# Patient Record
Sex: Male | Born: 1965 | Race: White | Hispanic: No | Marital: Single | Smoking: Never smoker
Health system: Southern US, Community
[De-identification: ages and names within clinical notes are randomized; demographics above are authoritative.]

## PROBLEM LIST (undated history)

## (undated) DIAGNOSIS — Z21 Asymptomatic human immunodeficiency virus [HIV] infection status: Secondary | ICD-10-CM

## (undated) DIAGNOSIS — B20 Human immunodeficiency virus [HIV] disease: Secondary | ICD-10-CM

---

## 2004-01-11 ENCOUNTER — Encounter: Admission: RE | Admit: 2004-01-11 | Discharge: 2004-01-11 | Payer: Self-pay | Admitting: Infectious Diseases

## 2004-01-15 ENCOUNTER — Encounter: Admission: RE | Admit: 2004-01-15 | Discharge: 2004-01-15 | Payer: Self-pay | Admitting: Infectious Diseases

## 2004-01-15 ENCOUNTER — Encounter (INDEPENDENT_AMBULATORY_CARE_PROVIDER_SITE_OTHER): Payer: Self-pay | Admitting: *Deleted

## 2004-01-15 LAB — CONVERTED CEMR LAB: CD4 T Cell Abs: 490

## 2004-02-06 ENCOUNTER — Encounter: Admission: RE | Admit: 2004-02-06 | Discharge: 2004-02-06 | Payer: Self-pay | Admitting: Internal Medicine

## 2004-05-07 ENCOUNTER — Encounter: Admission: RE | Admit: 2004-05-07 | Discharge: 2004-05-07 | Payer: Self-pay | Admitting: Internal Medicine

## 2004-08-20 ENCOUNTER — Ambulatory Visit (HOSPITAL_COMMUNITY): Admission: RE | Admit: 2004-08-20 | Discharge: 2004-08-20 | Payer: Self-pay | Admitting: Internal Medicine

## 2004-08-20 ENCOUNTER — Encounter: Admission: RE | Admit: 2004-08-20 | Discharge: 2004-08-20 | Payer: Self-pay | Admitting: Internal Medicine

## 2005-02-14 ENCOUNTER — Encounter (INDEPENDENT_AMBULATORY_CARE_PROVIDER_SITE_OTHER): Payer: Self-pay | Admitting: *Deleted

## 2005-02-14 ENCOUNTER — Ambulatory Visit (HOSPITAL_COMMUNITY): Admission: RE | Admit: 2005-02-14 | Discharge: 2005-02-14 | Payer: Self-pay | Admitting: Internal Medicine

## 2005-02-14 ENCOUNTER — Ambulatory Visit: Payer: Self-pay | Admitting: Internal Medicine

## 2005-02-14 LAB — CONVERTED CEMR LAB: HIV 1 RNA Quant: 188000 copies/mL

## 2005-03-06 ENCOUNTER — Ambulatory Visit: Payer: Self-pay | Admitting: Internal Medicine

## 2006-05-14 ENCOUNTER — Ambulatory Visit: Payer: Self-pay | Admitting: Internal Medicine

## 2006-05-14 ENCOUNTER — Encounter (INDEPENDENT_AMBULATORY_CARE_PROVIDER_SITE_OTHER): Payer: Self-pay | Admitting: *Deleted

## 2006-05-14 ENCOUNTER — Encounter: Admission: RE | Admit: 2006-05-14 | Discharge: 2006-05-14 | Payer: Self-pay | Admitting: Internal Medicine

## 2006-05-28 ENCOUNTER — Encounter (INDEPENDENT_AMBULATORY_CARE_PROVIDER_SITE_OTHER): Payer: Self-pay | Admitting: *Deleted

## 2006-05-28 ENCOUNTER — Ambulatory Visit: Payer: Self-pay | Admitting: Internal Medicine

## 2006-05-28 ENCOUNTER — Encounter: Admission: RE | Admit: 2006-05-28 | Discharge: 2006-05-28 | Payer: Self-pay | Admitting: Internal Medicine

## 2006-07-07 ENCOUNTER — Encounter: Admission: RE | Admit: 2006-07-07 | Discharge: 2006-07-07 | Payer: Self-pay | Admitting: Internal Medicine

## 2006-07-07 ENCOUNTER — Encounter (INDEPENDENT_AMBULATORY_CARE_PROVIDER_SITE_OTHER): Payer: Self-pay | Admitting: *Deleted

## 2006-07-07 ENCOUNTER — Ambulatory Visit: Payer: Self-pay | Admitting: Internal Medicine

## 2006-07-07 LAB — CONVERTED CEMR LAB
CD4 Count: 190 microliters
HIV 1 RNA Quant: 2330 copies/mL

## 2006-08-03 ENCOUNTER — Ambulatory Visit: Payer: Self-pay | Admitting: Internal Medicine

## 2007-02-15 ENCOUNTER — Encounter (INDEPENDENT_AMBULATORY_CARE_PROVIDER_SITE_OTHER): Payer: Self-pay | Admitting: *Deleted

## 2007-02-15 LAB — CONVERTED CEMR LAB

## 2007-02-28 ENCOUNTER — Encounter (INDEPENDENT_AMBULATORY_CARE_PROVIDER_SITE_OTHER): Payer: Self-pay | Admitting: *Deleted

## 2007-03-16 ENCOUNTER — Ambulatory Visit: Payer: Self-pay | Admitting: Internal Medicine

## 2007-03-16 ENCOUNTER — Encounter: Admission: RE | Admit: 2007-03-16 | Discharge: 2007-03-16 | Payer: Self-pay | Admitting: Internal Medicine

## 2007-03-16 DIAGNOSIS — B20 Human immunodeficiency virus [HIV] disease: Secondary | ICD-10-CM

## 2007-03-29 DIAGNOSIS — B1910 Unspecified viral hepatitis B without hepatic coma: Secondary | ICD-10-CM | POA: Insufficient documentation

## 2007-03-29 DIAGNOSIS — R209 Unspecified disturbances of skin sensation: Secondary | ICD-10-CM

## 2007-03-29 DIAGNOSIS — G510 Bell's palsy: Secondary | ICD-10-CM

## 2007-03-30 ENCOUNTER — Ambulatory Visit: Payer: Self-pay | Admitting: Internal Medicine

## 2007-09-07 ENCOUNTER — Ambulatory Visit: Payer: Self-pay | Admitting: Internal Medicine

## 2007-09-07 ENCOUNTER — Encounter: Admission: RE | Admit: 2007-09-07 | Discharge: 2007-09-07 | Payer: Self-pay | Admitting: Internal Medicine

## 2007-09-07 LAB — CONVERTED CEMR LAB: HIV 1 RNA Quant: 251 copies/mL — ABNORMAL HIGH (ref <50)

## 2007-09-21 ENCOUNTER — Ambulatory Visit: Payer: Self-pay | Admitting: Internal Medicine

## 2007-11-11 ENCOUNTER — Telehealth: Payer: Self-pay | Admitting: Internal Medicine

## 2008-01-19 ENCOUNTER — Telehealth: Payer: Self-pay | Admitting: Internal Medicine

## 2008-03-23 ENCOUNTER — Telehealth (INDEPENDENT_AMBULATORY_CARE_PROVIDER_SITE_OTHER): Payer: Self-pay | Admitting: *Deleted

## 2008-06-13 ENCOUNTER — Ambulatory Visit: Payer: Self-pay | Admitting: Internal Medicine

## 2008-06-13 ENCOUNTER — Encounter: Admission: RE | Admit: 2008-06-13 | Discharge: 2008-06-13 | Payer: Self-pay | Admitting: Internal Medicine

## 2008-06-13 LAB — CONVERTED CEMR LAB
AST: 19 units/L (ref 0–37)
Albumin: 4.6 g/dL (ref 3.5–5.2)
Alkaline Phosphatase: 87 units/L (ref 39–117)
BUN: 14 mg/dL (ref 6–23)
Creatinine, Ser: 0.98 mg/dL (ref 0.40–1.50)
Glucose, Bld: 90 mg/dL (ref 70–99)
HCT: 42.7 % (ref 39.0–52.0)
HDL: 58 mg/dL (ref >39)
Hemoglobin: 15.1 g/dL (ref 13.0–17.0)
LDL Cholesterol: 125 mg/dL — ABNORMAL HIGH (ref 0–99)
MCHC: 35.4 g/dL (ref 30.0–36.0)
MCV: 89 fL (ref 78.0–100.0)
Potassium: 4.7 meq/L (ref 3.5–5.3)
RBC: 4.8 M/uL (ref 4.22–5.81)
RDW: 12.9 % (ref 11.5–15.5)
Total Bilirubin: 0.6 mg/dL (ref 0.3–1.2)
Total CHOL/HDL Ratio: 3.6
Triglycerides: 119 mg/dL (ref <150)
VLDL: 24 mg/dL (ref 0–40)

## 2008-06-27 ENCOUNTER — Ambulatory Visit: Payer: Self-pay | Admitting: Internal Medicine

## 2008-09-28 ENCOUNTER — Ambulatory Visit: Payer: Self-pay | Admitting: Infectious Disease

## 2008-09-28 ENCOUNTER — Encounter: Payer: Self-pay | Admitting: Internal Medicine

## 2008-09-28 LAB — CONVERTED CEMR LAB: HIV 1 RNA Quant: 151 copies/mL — ABNORMAL HIGH (ref <50)

## 2008-10-31 ENCOUNTER — Ambulatory Visit: Payer: Self-pay | Admitting: Internal Medicine

## 2009-05-25 ENCOUNTER — Encounter (INDEPENDENT_AMBULATORY_CARE_PROVIDER_SITE_OTHER): Payer: Self-pay | Admitting: Licensed Clinical Social Worker

## 2009-05-25 ENCOUNTER — Ambulatory Visit: Payer: Self-pay | Admitting: Internal Medicine

## 2009-05-25 LAB — CONVERTED CEMR LAB
AST: 19 units/L (ref 0–37)
Albumin: 4.5 g/dL (ref 3.5–5.2)
BUN: 20 mg/dL (ref 6–23)
Basophils Relative: 0 % (ref 0–1)
CO2: 21 meq/L (ref 19–32)
Calcium: 9.1 mg/dL (ref 8.4–10.5)
Chloride: 107 meq/L (ref 96–112)
Cholesterol: 195 mg/dL (ref 0–200)
Creatinine, Ser: 1.02 mg/dL (ref 0.40–1.50)
Eosinophils Absolute: 0.2 10*3/uL (ref 0.0–0.7)
GFR calc Af Amer: >60 mL/min (ref >60)
HIV 1 RNA Quant: 623 copies/mL — ABNORMAL HIGH (ref <48)
HIV-1 RNA Quant, Log: 2.79 — ABNORMAL HIGH (ref <1.68)
MCHC: 33.2 g/dL (ref 30.0–36.0)
MCV: 91.2 fL (ref 78.0–100.0)
Neutro Abs: 2.2 10*3/uL (ref 1.7–7.7)
Neutrophils Relative %: 47 % (ref 43–77)
Platelets: 223 10*3/uL (ref 150–400)
Potassium: 4.5 meq/L (ref 3.5–5.3)
WBC: 4.8 10*3/uL (ref 4.0–10.5)

## 2009-06-12 ENCOUNTER — Ambulatory Visit: Payer: Self-pay | Admitting: Internal Medicine

## 2009-10-02 ENCOUNTER — Ambulatory Visit: Payer: Self-pay | Admitting: Internal Medicine

## 2009-10-02 LAB — CONVERTED CEMR LAB
HIV 1 RNA Quant: 121 copies/mL — ABNORMAL HIGH (ref <48)
HIV-1 RNA Quant, Log: 2.08 — ABNORMAL HIGH (ref <1.68)

## 2009-10-30 ENCOUNTER — Ambulatory Visit: Payer: Self-pay | Admitting: Internal Medicine

## 2010-02-01 ENCOUNTER — Ambulatory Visit: Payer: Self-pay | Admitting: Internal Medicine

## 2010-02-01 LAB — CONVERTED CEMR LAB
ALT: 20 units/L (ref 0–53)
AST: 22 units/L (ref 0–37)
Albumin: 4.1 g/dL (ref 3.5–5.2)
Alkaline Phosphatase: 84 units/L (ref 39–117)
BUN: 14 mg/dL (ref 6–23)
Basophils Absolute: 0 10*3/uL (ref 0.0–0.1)
Eosinophils Relative: 5 % (ref 0–5)
HDL: 45 mg/dL (ref >39)
LDL Cholesterol: 104 mg/dL — ABNORMAL HIGH (ref 0–99)
Lymphocytes Relative: 47 % — ABNORMAL HIGH (ref 12–46)
Neutro Abs: 1.8 10*3/uL (ref 1.7–7.7)
Neutrophils Relative %: 38 % — ABNORMAL LOW (ref 43–77)
Platelets: 204 10*3/uL (ref 150–400)
Potassium: 4.5 meq/L (ref 3.5–5.3)
RDW: 13.4 % (ref 11.5–15.5)
Sodium: 137 meq/L (ref 135–145)

## 2010-02-05 ENCOUNTER — Encounter: Payer: Self-pay | Admitting: Internal Medicine

## 2010-02-05 LAB — CONVERTED CEMR LAB

## 2010-02-26 ENCOUNTER — Ambulatory Visit: Payer: Self-pay | Admitting: Internal Medicine

## 2010-11-18 ENCOUNTER — Encounter (INDEPENDENT_AMBULATORY_CARE_PROVIDER_SITE_OTHER): Payer: Self-pay | Admitting: *Deleted

## 2011-01-19 LAB — CONVERTED CEMR LAB
ALT: 17 units/L (ref 0–53)
AST: 20 units/L (ref 0–37)
Basophils Absolute: 0 10*3/uL (ref 0.0–0.1)
Basophils Relative: 1 % (ref 0–1)
CO2: 26 meq/L (ref 19–32)
Calcium: 9.7 mg/dL (ref 8.4–10.5)
Chlamydia, Swab/Urine, PCR: NEGATIVE
Chloride: 102 meq/L (ref 96–112)
Cholesterol: 178 mg/dL (ref 0–200)
GC Probe Amp, Urine: NEGATIVE
HIV 1 RNA Quant: 751 copies/mL — ABNORMAL HIGH (ref <50)
Hemoglobin, Urine: NEGATIVE
Leukocytes, UA: NEGATIVE
Lymphocytes Relative: 46 % (ref 12–46)
Neutro Abs: 1.6 10*3/uL — ABNORMAL LOW (ref 1.7–7.7)
Neutrophils Relative %: 40 % — ABNORMAL LOW (ref 43–77)
Nitrite: NEGATIVE
Platelets: 249 10*3/uL (ref 150–400)
Protein, ur: NEGATIVE mg/dL
RDW: 13.5 % (ref 11.5–14.0)
Sodium: 137 meq/L (ref 135–145)
Total Bilirubin: 0.4 mg/dL (ref 0.3–1.2)
Total Protein: 7.6 g/dL (ref 6.0–8.3)
Urine Glucose: NEGATIVE mg/dL
VLDL: 23 mg/dL (ref 0–40)

## 2011-01-21 NOTE — Assessment & Plan Note (Signed)
Summary: F/U APPNT/KDW   History of Present Illness: Tracy Bentley is in for his routine follow-up visit.  He continues to take Atripla without any difficulty.  He thinks he's missed several doses since his last visit usually when he forgot to call in a refill requeston on time. He stopped taking Septra on his own several months ago, when his prescription ran out.  Current Allergies: No known allergies     Risk Factors:  Tobacco use:  never    Vital Signs:  Patient Profile:   45 Years Old Male Weight:      141.50 pounds Temp:     97.7 degrees F oral Pulse rate:   71 / minute BP sitting:   110 / 69             Is Patient Diabetic? No  Does patient need assistance? Functional Status Self care Ambulation Normal     Physical Exam  General:     alert and well-nourished.   Mouth:     good dentition and pharynx pink and moist.   Lungs:     normal breath sounds.   Heart:     normal rate, regular rhythm, and no murmur.      Impression & Recommendations:  Problem # 1:  HIV INFECTION (ICD-042) [name]'s HIV infection continues to improve.  He will now get his medications through mail order in a 90 day supply.  I encouraged him to be proactive and setup backup systems to remind himself to reorder. The following medications were removed from the medication list:    Septra Ds Tabs (Sulfamethoxazole-trimethoprim tabs) .Marland Kitchen... Take 1 tablet by mouth once a day  His updated medication list for this problem includes:    Atripla Tabs (Efavirenz-emtricitab-tenofovir tabs) .Marland Kitchen... Take 1 tablet by mouth once a day  Diagnostics Reviewed:  CD4: 320 (09/08/2007)   Hgb: 15.3 (03/16/2007)   HCT: 44.5 (03/16/2007)   HIV-1 RNA: 251 (09/07/2007)   HBSAg: NO (02/15/2007)  Orders: Est. Patient Level III (65784)  Future Orders: T-CBC No Diff ([id number]) ... 02/08/2008 T-CD4 ([id number]) ... 02/08/2008 T-Comprehensive Metabolic Panel [id number]) ... 02/08/2008 T-HIV Viral Load  [id number]) ... 02/08/2008 T-Lipid Profile [id number]) ... 02/08/2008 T-RPR (Syphilis) [id number]) ... 02/08/2008   Other Orders: Flu Vaccine 4yrs + (01093) Admin 1st Vaccine (23557)   Patient Instructions: 1)  Please schedule a follow-up appointment in 6 months.    Prescriptions: ATRIPLA  TABS (EFAVIRENZ-EMTRICITAB-TENOFOVIR TABS) Take 1 tablet by mouth once a day  #90 x 3   Entered and Authorized by:   Cliffton Asters MD   Signed by:   Cliffton Asters MD on 09/21/2007   Method used:   Print then Give to Patient   RxID:   [id number]  ]  Influenza Vaccine    Vaccine Type: Fluvax 3+    Site: left deltoid    Mfr: novartis    Dose: 0.5 ml    Route: IM    Given by: Starleen Arms    Exp. Date: [id number]    Lot #: [id number]    VIS given: 06/20/05 version given September 21, 2007.

## 2011-01-21 NOTE — Assessment & Plan Note (Signed)
Summary: F/U/VS   CC:  follow-up visit.  History of Present Illness: Tracy Bentley is in for his routine visit. He restarted Atripla  thin late January several weeks before he came in for his lab work.  He thinks he may have missed 3 doses since that time when he forgot and fell asleep before taking it.  He says that his roommate usually reminds him to take it.  When he and his roommate are sexually active he says that he does always used condoms.   Preventive Screening-Counseling & Management  Alcohol-Tobacco     Alcohol drinks/day: 1     Alcohol type: wine     Smoking Status: never  Caffeine-Diet-Exercise     Caffeine use/day: yes     Does Patient Exercise: yes     Type of exercise: gym     Exercise (avg: min/session): >60     Times/week: 3  Hep-HIV-STD-Contraception     HIV Risk: no risk noted     HIV Risk Counseling: to avoid increased HIV risk  Safety-Violence-Falls     Seat Belt Use: yes  Comments: declined condoms      Sexual History:  currently monogamous.        Drug Use:  current and marijuana.     Updated Prior Medication List: ATRIPLA 600-200-300 MG TABS (EFAVIRENZ-EMTRICITAB-TENOFOVIR) Take 1 tablet by mouth once a day  Current Allergies (reviewed today): No known allergies  Social History: Drug Use:  current, marijuana  Vital Signs:  Patient profile:   45 year old male Height:      65 inches (165.10 cm) Weight:      140.2 pounds (63.73 kg) BMI:     23.41 Temp:     97.0 degrees F (36.11 degrees C) oral Pulse rate:   62 / minute BP sitting:   118 / 72  (left arm) Cuff size:   regular  Vitals Entered By: Jennet Maduro RN (February 26, 2010 9:25 AM) CC: follow-up visit Is Patient Diabetic? No Pain Assessment Patient in pain? no      Nutritional Status BMI of 19 -24 = normal Nutritional Status Detail appetite "OK"  Have you ever been in a relationship where you felt threatened, hurt or afraid?No   Does patient need assistance? Functional  Status Self care Ambulation Normal Comments I'm taking them now.   Physical Exam  General:  alert and well-nourished.   Mouth:  good dentition and pharynx pink and moist.  no erythema and no exudates.   Lungs:  normal breath sounds.  no crackles and no wheezes.   Heart:  normal rate, regular rhythm, and no murmur.          Medication Adherence: 02/26/2010   Adherence to medications reviewed with patient. Counseling to provide adequate adherence provided   Prevention For Positives: 02/26/2010   Safe sex practices discussed with patient. Condoms offered.                             Impression & Recommendations:  Problem # 1:  HIV INFECTION (ICD-042) I had a long discussion with him today about adherence.  I gave him a pill box and asked him to use it and also set his cell phone alarm to remind him to take his medication.  I am hopeful that a repeat viral load will be down considerably today after being on his medication a little bit longer. Diagnostics Reviewed:  HIV: CDC-defined AIDS (  06/12/2009)   CD4: 430 (02/01/2010)   WBC: 4.8 (02/01/2010)   Hgb: 14.5 (02/01/2010)   HCT: 42.4 (02/01/2010)   Platelets: 204 (02/01/2010) HIV genotype: * (02/05/2010)   HIV-1 RNA: 993 (02/01/2010)   HBSAg: NO (02/15/2007)  Orders: T-HIV Viral Load ([id number]) Est. Patient Level III (99213)Future Orders: T-CD4SP ([facility]) (CD4SP) ... 05/27/2010 T-HIV Viral Load [id number]) ... 05/27/2010  Medications Added to Medication List This Visit: 1)  Atripla 600-200-300 Mg Tabs (Efavirenz-emtricitab-tenofovir) .... Take 1 tablet by mouth once a day  Patient Instructions: 1)  Please schedule a follow-up appointment in 3 months.  Process Orders Check Orders Results:     [facility]: ABN not required for this insurance Tests Sent for requisitioning (February 26, 2010 9:41 AM):     02/26/2010: [facility] -- T-HIV Viral Load [phone] (signed)     05/27/2010:  [facility] -- T-HIV Viral Load [phone] (signed)

## 2011-01-21 NOTE — Miscellaneous (Signed)
  Clinical Lists Changes  Observations: Added new observation of YEARAIDSPOS: 2007  (11/18/2010 15:02)

## 2011-01-21 NOTE — Miscellaneous (Signed)
Summary: Genotype ordered  Clinical Lists Changes  Orders: Added new Test order of T-HIV Genotype [id number]) - Signed     Process Orders Check Orders Results:     [facility]: ABN not required for this insurance Order queued for requisitioning for [facility]: February 05, 2010 11:01 AM  Tests Sent for requisitioning (February 05, 2010 11:01 AM):     02/05/2010: [facility] -- T-HIV Genotype [phone] (signed)

## 2011-03-03 ENCOUNTER — Other Ambulatory Visit: Payer: Self-pay | Admitting: Internal Medicine

## 2011-03-03 ENCOUNTER — Other Ambulatory Visit (INDEPENDENT_AMBULATORY_CARE_PROVIDER_SITE_OTHER): Payer: Managed Care, Other (non HMO)

## 2011-03-03 ENCOUNTER — Encounter: Payer: Self-pay | Admitting: Internal Medicine

## 2011-03-03 DIAGNOSIS — Z79899 Other long term (current) drug therapy: Secondary | ICD-10-CM

## 2011-03-03 DIAGNOSIS — B20 Human immunodeficiency virus [HIV] disease: Secondary | ICD-10-CM

## 2011-03-03 LAB — CONVERTED CEMR LAB
ALT: 18 units/L (ref 0–53)
CO2: 26 meq/L (ref 19–32)
Calcium: 9 mg/dL (ref 8.4–10.5)
Chloride: 102 meq/L (ref 96–112)
Cholesterol: 154 mg/dL (ref 0–200)
Creatinine, Ser: 0.95 mg/dL (ref 0.40–1.50)
HIV-1 RNA Quant, Log: 2.71 — ABNORMAL HIGH (ref <1.30)
Lymphocytes Relative: 35 % (ref 12–46)
Lymphs Abs: 1.8 10*3/uL (ref 0.7–4.0)
Neutrophils Relative %: 54 % (ref 43–77)
Platelets: 205 10*3/uL (ref 150–400)
Sodium: 138 meq/L (ref 135–145)
Total CHOL/HDL Ratio: 3.3
Total Protein: 7.4 g/dL (ref 6.0–8.3)
Triglycerides: 62 mg/dL (ref <150)
VLDL: 12 mg/dL (ref 0–40)
WBC: 5.3 10*3/uL (ref 4.0–10.5)

## 2011-03-04 ENCOUNTER — Other Ambulatory Visit: Payer: Self-pay

## 2011-03-04 LAB — T-HELPER CELL (CD4) - (RCID CLINIC ONLY): CD4 % Helper T Cell: 17 % — ABNORMAL LOW (ref 33–55)

## 2011-03-12 ENCOUNTER — Other Ambulatory Visit: Payer: Self-pay

## 2011-03-12 LAB — T-HELPER CELL (CD4) - (RCID CLINIC ONLY): CD4 % Helper T Cell: 18 % — ABNORMAL LOW (ref 33–55)

## 2011-03-18 ENCOUNTER — Encounter: Payer: Self-pay | Admitting: Internal Medicine

## 2011-03-18 ENCOUNTER — Ambulatory Visit (INDEPENDENT_AMBULATORY_CARE_PROVIDER_SITE_OTHER): Payer: Managed Care, Other (non HMO) | Admitting: Internal Medicine

## 2011-03-18 VITALS — BP 136/79 | HR 62 | Temp 98.2°F | Ht 66.0 in | Wt 134.8 lb

## 2011-03-18 DIAGNOSIS — K089 Disorder of teeth and supporting structures, unspecified: Secondary | ICD-10-CM

## 2011-03-18 DIAGNOSIS — B20 Human immunodeficiency virus [HIV] disease: Secondary | ICD-10-CM

## 2011-03-18 NOTE — Progress Notes (Signed)
  Subjective:    Patient ID: Tracy Bentley, male    DOB: 1966-08-04, 45 y.o.   MRN: [id number]  HPI Jaquin is in for his first visit in 1 year. Last year he could not afford his medication copays for the last 4-5 months of the year and was off completley during that time. He was able to restart Atripla in January and has only missed one dose since that time. He uses condoms routinely with his boy friend. He is on amoxicillin for a broken tooth that was removed yesterday.    Review of Systems     Objective:   Physical Exam  Constitutional: He appears well-developed and well-nourished.  HENT:  Mouth/Throat: Oropharynx is clear and moist.  Cardiovascular: Normal rate, regular rhythm and normal heart sounds.   Pulmonary/Chest: Breath sounds normal.          Assessment & Plan:

## 2011-03-18 NOTE — Assessment & Plan Note (Signed)
His CD4 is down to 310 after his recent gap in therapy but his VL is lower than last year at 58. I will repeat labs in 3 months.

## 2011-03-26 ENCOUNTER — Ambulatory Visit: Payer: Self-pay | Admitting: Internal Medicine

## 2011-03-27 LAB — T-HELPER CELL (CD4) - (RCID CLINIC ONLY): CD4 T Cell Abs: 650 uL (ref 400–2700)

## 2011-03-31 LAB — T-HELPER CELL (CD4) - (RCID CLINIC ONLY)
CD4 % Helper T Cell: 27 % — ABNORMAL LOW (ref 33–55)
CD4 T Cell Abs: 560 uL (ref 400–2700)

## 2011-09-18 LAB — T-HELPER CELL (CD4) - (RCID CLINIC ONLY): CD4 T Cell Abs: 530

## 2011-09-22 LAB — T-HELPER CELL (CD4) - (RCID CLINIC ONLY)
CD4 % Helper T Cell: 30 — ABNORMAL LOW
CD4 T Cell Abs: 500

## 2012-02-24 ENCOUNTER — Other Ambulatory Visit: Payer: Managed Care, Other (non HMO)

## 2012-02-25 ENCOUNTER — Other Ambulatory Visit: Payer: Managed Care, Other (non HMO)

## 2012-02-25 ENCOUNTER — Other Ambulatory Visit: Payer: Self-pay | Admitting: Internal Medicine

## 2012-02-25 DIAGNOSIS — Z113 Encounter for screening for infections with a predominantly sexual mode of transmission: Secondary | ICD-10-CM

## 2012-02-25 DIAGNOSIS — Z79899 Other long term (current) drug therapy: Secondary | ICD-10-CM

## 2012-02-25 DIAGNOSIS — B20 Human immunodeficiency virus [HIV] disease: Secondary | ICD-10-CM

## 2012-02-26 ENCOUNTER — Other Ambulatory Visit: Payer: Managed Care, Other (non HMO)

## 2012-02-26 DIAGNOSIS — B20 Human immunodeficiency virus [HIV] disease: Secondary | ICD-10-CM

## 2012-02-26 DIAGNOSIS — Z79899 Other long term (current) drug therapy: Secondary | ICD-10-CM

## 2012-02-26 DIAGNOSIS — Z113 Encounter for screening for infections with a predominantly sexual mode of transmission: Secondary | ICD-10-CM

## 2012-02-26 LAB — CBC WITH DIFFERENTIAL/PLATELET
Basophils Absolute: 0 10*3/uL (ref 0.0–0.1)
Eosinophils Relative: 3 % (ref 0–5)
HCT: 39.6 % (ref 39.0–52.0)
Hemoglobin: 13.3 g/dL (ref 13.0–17.0)
Lymphocytes Relative: 45 % (ref 12–46)
Lymphs Abs: 1.3 10*3/uL (ref 0.7–4.0)
MCV: 85.3 fL (ref 78.0–100.0)
Monocytes Absolute: 0.4 10*3/uL (ref 0.1–1.0)
Monocytes Relative: 13 % — ABNORMAL HIGH (ref 3–12)
Neutro Abs: 1.1 10*3/uL — ABNORMAL LOW (ref 1.7–7.7)
RBC: 4.64 MIL/uL (ref 4.22–5.81)
RDW: 13.5 % (ref 11.5–15.5)
WBC: 3 10*3/uL — ABNORMAL LOW (ref 4.0–10.5)

## 2012-02-26 LAB — COMPREHENSIVE METABOLIC PANEL
ALT: 26 U/L (ref 0–53)
BUN: 14 mg/dL (ref 6–23)
CO2: 25 mEq/L (ref 19–32)
Calcium: 9.4 mg/dL (ref 8.4–10.5)
Chloride: 105 mEq/L (ref 96–112)
Creat: 1 mg/dL (ref 0.50–1.35)
Glucose, Bld: 83 mg/dL (ref 70–99)
Total Bilirubin: 0.8 mg/dL (ref 0.3–1.2)

## 2012-02-26 LAB — LIPID PANEL
HDL: 51 mg/dL (ref >39)
LDL Cholesterol: 82 mg/dL (ref 0–99)
Total CHOL/HDL Ratio: 2.9 Ratio
Triglycerides: 68 mg/dL (ref <150)

## 2012-02-26 LAB — RPR

## 2012-03-01 LAB — HIV-1 RNA QUANT-NO REFLEX-BLD: HIV-1 RNA Quant, Log: 5.24 {Log} — ABNORMAL HIGH (ref <1.30)

## 2012-03-09 ENCOUNTER — Ambulatory Visit (INDEPENDENT_AMBULATORY_CARE_PROVIDER_SITE_OTHER): Payer: Managed Care, Other (non HMO) | Admitting: Internal Medicine

## 2012-03-09 ENCOUNTER — Encounter: Payer: Self-pay | Admitting: Internal Medicine

## 2012-03-09 VITALS — BP 125/76 | HR 60 | Temp 98.8°F | Ht 66.0 in | Wt 135.5 lb

## 2012-03-09 DIAGNOSIS — B20 Human immunodeficiency virus [HIV] disease: Secondary | ICD-10-CM

## 2012-03-09 MED ORDER — AZITHROMYCIN 600 MG PO TABS: 1200.0000 mg | ORAL_TABLET | ORAL | Status: AC

## 2012-03-09 MED ORDER — FLUCONAZOLE 100 MG PO TABS: 100.0000 mg | ORAL_TABLET | ORAL | Status: AC

## 2012-03-09 MED ORDER — SULFAMETHOXAZOLE-TRIMETHOPRIM 800-160 MG PO TABS: 1.0000 | ORAL_TABLET | Freq: Every day | ORAL | Status: DC

## 2012-03-09 MED ORDER — EFAVIRENZ-EMTRICITAB-TENOFOVIR 600-200-300 MG PO TABS: 1.0000 | ORAL_TABLET | Freq: Every day | ORAL | Status: DC

## 2012-03-09 NOTE — Progress Notes (Signed)
Patient ID: Tracy Bentley, male   DOB: [birth year], 46 y.o.   MRN: [id number]  INFECTIOUS DISEASE PROGRESS NOTE    Subjective: Tracy Bentley is in for his first visit in one year. He tells me that he has been off of Atripla for approximately the last 10 months because of difficulty affording his insurance co-pays. He has a healthcare savings account and had access to the Atripla discount cards but still could not afford several $100 each month. He is not on any medication currently. Fortunately he feels well and has not had any problems since his last visit.  Objective: Temp: 98.8 F (37.1 C) (03/19 1040) Temp src: Oral (03/19 1040) BP: 125/76 mmHg (03/19 1040) Pulse Rate: 60  (03/19 1040)  General: He appears comfortable and in no distress Skin: Mouth and throat are clear he has no rash Lungs: Clear Cor: Regular S1 and S2 no murmurs  Lab Results HIV 1 RNA Quant (copies/mL)  Date Value  02/26/2012 172190*  03/03/2011 507*  02/26/2010 1030*     CD4 T Cell Abs (cmm)  Date Value  02/26/2012 60*  03/03/2011 310*  02/01/2010 430      Assessment: Unfortunately his infection has progressed significantly since he has been off of therapy. I will start usual HIV prophylaxis today and we will have him see our financial counselor to try to get him any further assistance that he is eligible for. I will also investigate whether or not he might be eligible for a treatment study that would provide free medication.  Plan: 1. Start trimethoprim sulfamethoxazole, azithromycin and fluconazole 2. Try to restart Atripla soon as possible 3. Followup within 3 months   Tracy Asters, MD [facility] [phone] pager   [phone] cell 03/09/2012, 11:33 AM

## 2012-03-09 NOTE — Progress Notes (Signed)
Pt given new co-pay card for Atripla.  Pt to activate and take to [facility] when picking up rx.

## 2012-03-17 ENCOUNTER — Other Ambulatory Visit: Payer: Self-pay | Admitting: Internal Medicine

## 2012-03-17 DIAGNOSIS — Z113 Encounter for screening for infections with a predominantly sexual mode of transmission: Secondary | ICD-10-CM

## 2012-03-31 ENCOUNTER — Other Ambulatory Visit: Payer: Self-pay | Admitting: *Deleted

## 2012-03-31 DIAGNOSIS — Z113 Encounter for screening for infections with a predominantly sexual mode of transmission: Secondary | ICD-10-CM

## 2012-05-26 ENCOUNTER — Other Ambulatory Visit: Payer: Managed Care, Other (non HMO)

## 2012-06-08 ENCOUNTER — Telehealth: Payer: Self-pay | Admitting: *Deleted

## 2012-06-08 NOTE — Telephone Encounter (Signed)
Message left re:  appt 06/09/12.

## 2012-06-09 ENCOUNTER — Encounter: Payer: Self-pay | Admitting: *Deleted

## 2012-06-09 ENCOUNTER — Ambulatory Visit: Payer: Managed Care, Other (non HMO) | Admitting: Internal Medicine

## 2012-06-09 ENCOUNTER — Telehealth: Payer: Self-pay | Admitting: *Deleted

## 2012-06-09 NOTE — Telephone Encounter (Signed)
Phone mailbox full.  Sending letter.

## 2013-02-24 ENCOUNTER — Emergency Department (INDEPENDENT_AMBULATORY_CARE_PROVIDER_SITE_OTHER)
Admission: EM | Admit: 2013-02-24 | Discharge: 2013-02-24 | Disposition: A | Discharge status: Home / Self Care | Payer: Self-pay | Source: Home / Self Care

## 2013-02-24 ENCOUNTER — Encounter (HOSPITAL_COMMUNITY): Payer: Self-pay | Admitting: Emergency Medicine

## 2013-02-24 ENCOUNTER — Emergency Department (INDEPENDENT_AMBULATORY_CARE_PROVIDER_SITE_OTHER): Payer: Managed Care, Other (non HMO)

## 2013-02-24 DIAGNOSIS — J40 Bronchitis, not specified as acute or chronic: Secondary | ICD-10-CM

## 2013-02-24 HISTORY — DX: Asymptomatic human immunodeficiency virus (hiv) infection status: Z21

## 2013-02-24 HISTORY — DX: Human immunodeficiency virus (HIV) disease: B20

## 2013-02-24 MED ORDER — ALBUTEROL SULFATE HFA 108 (90 BASE) MCG/ACT IN AERS: 2.0000 | INHALATION_SPRAY | RESPIRATORY_TRACT | Status: DC | PRN

## 2013-02-24 MED ORDER — DOXYCYCLINE HYCLATE 100 MG PO CAPS: 100.0000 mg | ORAL_CAPSULE | Freq: Two times a day (BID) | ORAL | Status: DC

## 2013-02-24 MED ORDER — PHENYLEPHRINE-CHLORPHEN-DM 10-4-12.5 MG/5ML PO LIQD: 5.0000 mL | ORAL | Status: DC | PRN

## 2013-02-24 NOTE — ED Notes (Signed)
Sinus congestion and cough, cough is persistent and leaves patient breathless, also feels lightheaded at times

## 2013-02-24 NOTE — ED Provider Notes (Signed)
History     CSN: [id number]  Arrival date & time 02/24/13  1529   None     Chief Complaint  Patient presents with  . Cough    (Consider location/radiation/quality/duration/timing/severity/associated sxs/prior treatment) HPI Comments: 47 year old male presents with complaint of cough for 3 weeks. He also has had congestion in his ears feel congested. Is positive for copious PND. He also he has shortness of breath related to coughing spells. This is a new symptom. When asked about fever he states it comes and goes. He denies earache, sore throat or chest pain. Denies GI symptoms.   Past Medical History  Diagnosis Date  . HIV infection     History reviewed. No pertinent past surgical history.  No family history on file.  History  Substance Use Topics  . Smoking status: Never Smoker   . Smokeless tobacco: Never Used  . Alcohol Use: 3.5 oz/week    7 drink(s) per week      Review of Systems  Constitutional: Positive for fever and activity change. Negative for diaphoresis and fatigue.  HENT: Positive for congestion and postnasal drip. Negative for ear pain, sore throat, facial swelling, rhinorrhea, trouble swallowing, neck pain and neck stiffness.   Eyes: Negative for pain, discharge and redness.  Respiratory: Positive for cough and shortness of breath. Negative for chest tightness and wheezing.   Cardiovascular: Negative.   Gastrointestinal: Negative.   Musculoskeletal: Negative.   Skin: Positive for pallor. Negative for rash.  Neurological: Negative.     Allergies  Review of patient's allergies indicates no known allergies.  Home Medications   Current Outpatient Rx  Name  Route  Sig  Dispense  Refill  . Dextromethorphan-Guaifenesin (ROBITUSSIN DM PO)   Oral   Take by mouth.         . Efavirenz (SUSTIVA PO)   Oral   Take by mouth.         . Emtricitabine-Tenofovir (TRUVADA PO)   Oral   Take by mouth.         . Ritonavir (NORVIR PO)   Oral   Take by  mouth.         Marland Kitchen albuterol (PROVENTIL HFA;VENTOLIN HFA) 108 (90 BASE) MCG/ACT inhaler   Inhalation   Inhale 2 puffs into the lungs every 4 (four) hours as needed for wheezing.   1 Inhaler   0   . doxycycline (VIBRAMYCIN) 100 MG capsule   Oral   Take 1 capsule (100 mg total) by mouth 2 (two) times daily.   20 capsule   0   . efavirenz-emtrictabine-tenofovir (ATRIPLA) 600-200-300 MG per tablet   Oral   Take 1 tablet by mouth at bedtime.   30 tablet   11   . Phenylephrine-Chlorphen-DM 09-25-11.5 MG/5ML LIQD   Oral   Take 5 mLs by mouth every 4 (four) hours as needed.   120 mL   0     BP 116/81  Pulse 139  Temp(Src) 98.4 F (36.9 C) (Oral)  Resp 18  SpO2 97%  Physical Exam  Nursing note and vitals reviewed. Constitutional: He is oriented to person, place, and time. No distress.  Thin, gaunt, appearing mildly ill  HENT:  Right Ear: External ear normal.  Left Ear: External ear normal.  Mouth/Throat: No oropharyngeal exudate.  OP moist , mildly red, moderate clear PND. No exudates  Eyes: Conjunctivae and EOM are normal.  Neck: Normal range of motion. Neck supple.  Cardiovascular: Normal heart sounds.   Tachycardia  Pulmonary/Chest: Effort normal and breath sounds normal. No respiratory distress. He has no wheezes. He has no rales.  Abdominal: Soft. There is no tenderness.  Musculoskeletal: He exhibits no edema.  Lymphadenopathy:    He has no cervical adenopathy.  Neurological: He is alert and oriented to person, place, and time. He exhibits normal muscle tone.  Skin: Skin is warm. There is pallor.  Psychiatric: He has a normal mood and affect.    ED Course  Procedures (including critical care time)  Labs Reviewed - No data to display Dg Chest 2 View  02/24/2013  *RADIOLOGY REPORT*  Clinical Data: History of coughing, dyspnea, and HIV.  3-week history of coughing and congestion with intermittent fever.  No history of respiratory disease.  CHEST - 2 VIEW   Comparison: None.  Findings: Cardiac silhouette is upper normal size.  There is slight accentuation by pectus excavatum.  No hilar or mediastinal enlargement is seen.  No pulmonary edema, pneumonia, or pleural effusion is seen.  Minimal central peribronchial thickening is present.  There is slight scoliosis convexity to the right.  IMPRESSION: No pulmonary edema, pneumonia, or pleural effusion is evident. Minimal central peribronchial thickening is present.  Slight scoliosis.   Original Report Authenticated By: Onalee Hua Call      1. URI (upper respiratory infection)   2. Bronchitis   3. Cough       MDM  Doxycycline 100 mg twice a day. This is prescribed primarily due to his immunocompromise state of health. Norell CS 1 teaspoon every 4 hours when necessary cough and congestion and drainage. Albuterol HFA 2 puffs every 4 hours as needed for coughing spasms or perception of wheezes. Recommend followup with the HIV clinic as soon as possible. Push the fluids, may be a little dehydrated.         Hayden Rasmussen, NP 02/24/13 1710  Hayden Rasmussen, NP 02/24/13 [phone]

## 2013-02-24 NOTE — ED Provider Notes (Signed)
Medical screening examination/treatment/procedure(s) were performed by resident physician or non-physician practitioner and as supervising physician I was immediately available for consultation/collaboration.   Barkley Bruns MD.   Linna Hoff, MD 02/24/13 [phone]

## 2013-02-24 NOTE — ED Notes (Signed)
Offered ice/drinks to both patient and family.

## 2013-04-28 ENCOUNTER — Other Ambulatory Visit: Payer: Self-pay | Admitting: Internal Medicine

## 2013-04-28 ENCOUNTER — Other Ambulatory Visit: Payer: Self-pay | Admitting: *Deleted

## 2013-04-28 ENCOUNTER — Ambulatory Visit: Payer: Self-pay

## 2013-04-28 ENCOUNTER — Other Ambulatory Visit (INDEPENDENT_AMBULATORY_CARE_PROVIDER_SITE_OTHER): Payer: Self-pay

## 2013-04-28 DIAGNOSIS — Z113 Encounter for screening for infections with a predominantly sexual mode of transmission: Secondary | ICD-10-CM

## 2013-04-28 DIAGNOSIS — Z79899 Other long term (current) drug therapy: Secondary | ICD-10-CM

## 2013-04-28 DIAGNOSIS — B20 Human immunodeficiency virus [HIV] disease: Secondary | ICD-10-CM

## 2013-04-28 LAB — CBC WITH DIFFERENTIAL/PLATELET
Basophils Absolute: 0 10*3/uL (ref 0.0–0.1)
Basophils Relative: 0 % (ref 0–1)
Eosinophils Absolute: 0.2 10*3/uL (ref 0.0–0.7)
Eosinophils Relative: 5 % (ref 0–5)
HCT: 42.7 % (ref 39.0–52.0)
MCHC: 34.7 g/dL (ref 30.0–36.0)
MCV: 92 fL (ref 78.0–100.0)
Monocytes Absolute: 0.4 10*3/uL (ref 0.1–1.0)
RDW: 17.2 % — ABNORMAL HIGH (ref 11.5–15.5)

## 2013-04-28 LAB — COMPREHENSIVE METABOLIC PANEL
AST: 16 U/L (ref 0–37)
Alkaline Phosphatase: 65 U/L (ref 39–117)
BUN: 13 mg/dL (ref 6–23)
Calcium: 9.6 mg/dL (ref 8.4–10.5)
Chloride: 102 mEq/L (ref 96–112)
Creat: 1.16 mg/dL (ref 0.50–1.35)

## 2013-04-28 LAB — RPR

## 2013-04-28 LAB — LIPID PANEL
HDL: 48 mg/dL (ref >39)
Total CHOL/HDL Ratio: 3.8 Ratio

## 2013-04-28 MED ORDER — EFAVIRENZ-EMTRICITAB-TENOFOVIR 600-200-300 MG PO TABS: 1.0000 | ORAL_TABLET | Freq: Every day | ORAL | Status: DC

## 2013-04-28 NOTE — Progress Notes (Signed)
rx printed for ADAP renewal. Andree Coss, RN

## 2013-05-12 ENCOUNTER — Telehealth: Payer: Self-pay | Admitting: *Deleted

## 2013-05-12 ENCOUNTER — Ambulatory Visit: Payer: Self-pay | Admitting: Internal Medicine

## 2013-05-12 NOTE — Telephone Encounter (Signed)
RN called patient to notify him that his ADAP was approved and to see where he would like his medication sent.  Upon chart review, patient has not seen a doctor here since 02/2012.  Pt had labs drawn 04/28/13, but will need a follow up with Dr. Orvan Falconer before we can send his prescription to a pharmacy.   Patient's phone was not accepting incoming calls.  Patient's work number has been disconnected.  Generic message left on patient's emergency contact's phone, notifying him that he is "listed as Tracy Bentley's emergency contact and that [place] needs to contact his doctor's office." Andree Coss, RN

## 2013-05-23 ENCOUNTER — Ambulatory Visit (INDEPENDENT_AMBULATORY_CARE_PROVIDER_SITE_OTHER): Payer: Self-pay | Admitting: Internal Medicine

## 2013-05-23 VITALS — BP 120/82 | HR 102 | Temp 97.4°F | Resp 16 | Ht 66.0 in | Wt 144.8 lb

## 2013-05-23 DIAGNOSIS — B20 Human immunodeficiency virus [HIV] disease: Secondary | ICD-10-CM

## 2013-05-23 MED ORDER — FLUCONAZOLE 100 MG PO TABS: 100.0000 mg | ORAL_TABLET | ORAL | Status: DC

## 2013-05-23 MED ORDER — AZITHROMYCIN 600 MG PO TABS: 600.0000 mg | ORAL_TABLET | ORAL | Status: DC

## 2013-05-23 MED ORDER — SULFAMETHOXAZOLE-TMP DS 800-160 MG PO TABS: 1.0000 | ORAL_TABLET | Freq: Every day | ORAL | Status: DC

## 2013-05-23 MED ORDER — EFAVIRENZ-EMTRICITAB-TENOFOVIR 600-200-300 MG PO TABS: 1.0000 | ORAL_TABLET | Freq: Every day | ORAL | Status: DC

## 2013-05-23 NOTE — Progress Notes (Signed)
Patient ID: Tracy Bentley, male   DOB: 02-09-1966, 47 y.o.   MRN: [id number]          [facility]  Patient Active Problem List   Diagnosis Date Noted  . HIV INFECTION 03/16/2007    Priority: High  . Poor dentition 03/18/2011  . HEPATITIS B 03/29/2007  . 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 NUMBNESS 03/29/2007    Patient's Medications  New Prescriptions   EFAVIRENZ-EMTRICITABINE-TENOFOVIR (ATRIPLA) 600-200-300 MG PER TABLET    Take 1 tablet by mouth at bedtime.  Previous Medications   ALBUTEROL (PROVENTIL HFA;VENTOLIN HFA) 108 (90 BASE) MCG/ACT INHALER    Inhale 2 puffs into the lungs every 4 (four) hours as needed for wheezing.  Modified Medications   No medications on file  Discontinued Medications   DARUNAVIR ETHANOLATE (PREZISTA) 800 MG TABLET    Take 800 mg by mouth.   DEXTROMETHORPHAN-GUAIFENESIN (ROBITUSSIN DM PO)    Take by mouth.   DOXYCYCLINE (VIBRAMYCIN) 100 MG CAPSULE    Take 1 capsule (100 mg total) by mouth 2 (two) times daily.   EFAVIRENZ (SUSTIVA PO)    Take by mouth.   EFAVIRENZ-EMTRICITABINE-TENOFOVIR (ATRIPLA) 600-200-300 MG PER TABLET    Take 1 tablet by mouth at bedtime.   EMTRICITABINE-TENOFOVIR (TRUVADA PO)    Take by mouth.   PHENYLEPHRINE-CHLORPHEN-DM 09-25-11.5 MG/5ML LIQD    Take 5 mLs by mouth every 4 (four) hours as needed.   RITONAVIR (NORVIR PO)    Take by mouth.    Subjective: Zaiah is in for his first visit in one year. At that last visit he started on Atripla again but lost his job and insurance and ran out after 30 days. He was off of all medications until February of this year when he started taking his partners Truvada, Prezista and Norvir. He states that his partner has more than enough medication for them both to take the regimen consistently without missing doses. He is feeling a little bit better since restarting therapy. When he came for blood work recently he took our Artist and he says that he recently got  a letter stating that his ADAP hasn't been approved.  Review of Systems: Pertinent items are noted in HPI.  Past Medical History  Diagnosis Date  . HIV infection     History  Substance Use Topics  . Smoking status: Never Smoker   . Smokeless tobacco: Never Used  . Alcohol Use: 3.5 oz/week    7 drink(s) per week    No family history on file.  No Known Allergies  Objective: Temp: 97.4 F (36.3 C) (06/02 1508) Temp src: Oral (06/02 1508) BP: 120/82 mmHg (06/02 1508) Pulse Rate: 102 (06/02 1508)  General: He has a flat affect as usual Oral: No oropharyngeal lesions Skin: No rash Lungs: Clear Cor: Regular S1 and S2 no murmurs  Lab Results HIV 1 RNA Quant (copies/mL)  Date Value  04/28/2013 63*  02/26/2012 172190*  03/03/2011 507*     CD4 T Cell Abs (cmm)  Date Value  04/28/2013 80*  02/26/2012 60*  03/03/2011 310*     Assessment: His viral load has started to suppress since starting his partners antiretroviral regimen. I encouraged him to always call right away if he is without his medication. Now that his ADAP has been approved I will switch him back to his simple regimen of Atripla and repeat blood work in 6 weeks. He needs to be on prophylactic medications.  Plan: 1.  Change antiretroviral regimen to Atripla 2. Start PCP, Mycobacterium avium, and candidal prophylaxis 3. Followup after lab work in 6 weeks   Cliffton Asters, MD [facility] [phone] pager   [phone] cell 05/23/2013, 3:37 PM

## 2013-05-26 ENCOUNTER — Other Ambulatory Visit: Payer: Self-pay | Admitting: *Deleted

## 2013-05-26 DIAGNOSIS — B20 Human immunodeficiency virus [HIV] disease: Secondary | ICD-10-CM

## 2013-05-26 MED ORDER — AZITHROMYCIN 600 MG PO TABS: 600.0000 mg | ORAL_TABLET | ORAL | Status: DC

## 2013-05-26 MED ORDER — SULFAMETHOXAZOLE-TMP DS 800-160 MG PO TABS: 1.0000 | ORAL_TABLET | Freq: Every day | ORAL | Status: DC

## 2013-05-26 MED ORDER — FLUCONAZOLE 100 MG PO TABS: 100.0000 mg | ORAL_TABLET | ORAL | Status: DC

## 2013-06-30 NOTE — Addendum Note (Signed)
Addended by: Jennet Maduro D on: 06/30/2013 03:16 PM   Modules accepted: Orders

## 2013-07-11 ENCOUNTER — Other Ambulatory Visit (INDEPENDENT_AMBULATORY_CARE_PROVIDER_SITE_OTHER): Payer: Self-pay

## 2013-07-11 DIAGNOSIS — B20 Human immunodeficiency virus [HIV] disease: Secondary | ICD-10-CM

## 2013-07-11 LAB — COMPREHENSIVE METABOLIC PANEL
Albumin: 4.2 g/dL (ref 3.5–5.2)
Alkaline Phosphatase: 84 U/L (ref 39–117)
BUN: 11 mg/dL (ref 6–23)
CO2: 28 mEq/L (ref 19–32)
Calcium: 9.4 mg/dL (ref 8.4–10.5)
Chloride: 102 mEq/L (ref 96–112)
Glucose, Bld: 82 mg/dL (ref 70–99)
Potassium: 4.1 mEq/L (ref 3.5–5.3)

## 2013-07-11 LAB — CBC
MCH: 31.9 pg (ref 26.0–34.0)
MCV: 88.6 fL (ref 78.0–100.0)
Platelets: 214 10*3/uL (ref 150–400)
RBC: 4.58 MIL/uL (ref 4.22–5.81)

## 2013-07-12 LAB — T-HELPER CELL (CD4) - (RCID CLINIC ONLY)
CD4 % Helper T Cell: 5 % — ABNORMAL LOW (ref 33–55)
CD4 T Cell Abs: 90 uL — ABNORMAL LOW (ref 400–2700)

## 2013-07-25 ENCOUNTER — Ambulatory Visit (INDEPENDENT_AMBULATORY_CARE_PROVIDER_SITE_OTHER): Payer: Self-pay | Admitting: Internal Medicine

## 2013-07-25 VITALS — BP 115/72 | HR 75 | Temp 98.3°F | Ht 66.0 in | Wt 140.8 lb

## 2013-07-25 DIAGNOSIS — B20 Human immunodeficiency virus [HIV] disease: Secondary | ICD-10-CM

## 2013-07-25 NOTE — Progress Notes (Signed)
Patient ID: Tracy Bentley, male   DOB: 1966-11-12, 47 y.o.   MRN: [id number]          [facility] for Infectious Disease  Patient Active Problem List   Diagnosis Date Noted  . HIV INFECTION 03/16/2007    Priority: High  . Poor dentition 03/18/2011  . HEPATITIS B 03/29/2007  . 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 NUMBNESS 03/29/2007    Patient's Medications  New Prescriptions   No medications on file  Previous Medications   ALBUTEROL (PROVENTIL HFA;VENTOLIN HFA) 108 (90 BASE) MCG/ACT INHALER    Inhale 2 puffs into the lungs every 4 (four) hours as needed for wheezing.   AZITHROMYCIN (ZITHROMAX) 600 MG TABLET    Take 1 tablet (600 mg total) by mouth every 7 (seven) days.   EFAVIRENZ-EMTRICITABINE-TENOFOVIR (ATRIPLA) 600-200-300 MG PER TABLET    Take 1 tablet by mouth at bedtime.   FLUCONAZOLE (DIFLUCAN) 100 MG TABLET    Take 1 tablet (100 mg total) by mouth once a week.   SULFAMETHOXAZOLE-TRIMETHOPRIM (BACTRIM DS) 800-160 MG PER TABLET    Take 1 tablet by mouth daily.  Modified Medications   No medications on file  Discontinued Medications   No medications on file    Subjective: Tracy Bentley is in for his routine visit. He believes he may have missed a few doses of his Atripla since his last visit. He has a pillbox but is not using it. He says that one of his antibiotics causes him some upset stomach. He's had no real March or vomiting though. He is not sure which of his antibiotics might be causing the problem. He believes he is tolerating Atripla well.  He is feeling better since restarting Atripla. His appetite is improved and his regain some weight. He also feels like his mood is better.  Review of Systems: Pertinent items are noted in HPI.  Past Medical History  Diagnosis Date  . HIV infection     History  Substance Use Topics  . Smoking status: Never Smoker   . Smokeless tobacco: Never Used  . Alcohol Use: 3.5 oz/week    7 drink(s) per week    No family history on  file.  No Known Allergies  Objective: Temp: 98.3 F (36.8 C) (08/04 1348) Temp src: Oral (08/04 1348) BP: 115/72 mmHg (08/04 1348) Pulse Rate: 75 (08/04 1348)  General: He is in better spirits than usual Oral: No oropharyngeal lesions Skin: No rash Lungs: Clear  Lab Results HIV 1 RNA Quant (copies/mL)  Date Value  07/11/2013 <20   04/28/2013 63*  02/26/2012 172190*     CD4 T Cell Abs (cmm)  Date Value  07/11/2013 90*  04/28/2013 80*  02/26/2012 60*     Assessment: Tracy Bentley is doing much better on the Atripla and his viral load is now undetectable. I instructed him to change his trimethoprim sulfamethoxazole prophylaxis to Monday Wednesday Friday to see if that helps his stomach upset. Also suggested he try using his pillbox to help remind him to take his medication.  Plan: 1. Continue current medications except change trimethoprim sulfamethoxazole to Monday Wednesday Friday 2. His pillbox 3. Followup after lab work in 3 months   Cliffton Asters, MD [facility] for Infectious Disease [facility] [phone] pager   [phone] cell 07/25/2013, 2:02 PM

## 2013-08-25 ENCOUNTER — Ambulatory Visit: Payer: Self-pay

## 2013-08-30 ENCOUNTER — Ambulatory Visit: Payer: Self-pay

## 2013-10-24 ENCOUNTER — Other Ambulatory Visit (INDEPENDENT_AMBULATORY_CARE_PROVIDER_SITE_OTHER): Payer: Self-pay

## 2013-10-24 DIAGNOSIS — B20 Human immunodeficiency virus [HIV] disease: Secondary | ICD-10-CM

## 2013-10-25 ENCOUNTER — Other Ambulatory Visit: Payer: Self-pay

## 2013-10-25 LAB — HIV-1 RNA QUANT-NO REFLEX-BLD: HIV 1 RNA Quant: <20 copies/mL (ref <20)

## 2013-10-27 ENCOUNTER — Other Ambulatory Visit: Payer: Self-pay

## 2013-11-08 ENCOUNTER — Telehealth: Payer: Self-pay | Admitting: *Deleted

## 2013-11-08 ENCOUNTER — Ambulatory Visit (INDEPENDENT_AMBULATORY_CARE_PROVIDER_SITE_OTHER): Payer: Self-pay | Admitting: Internal Medicine

## 2013-11-08 VITALS — BP 129/81 | HR 56 | Ht 66.0 in | Wt 146.8 lb

## 2013-11-08 DIAGNOSIS — B20 Human immunodeficiency virus [HIV] disease: Secondary | ICD-10-CM

## 2013-11-08 DIAGNOSIS — Z23 Encounter for immunization: Secondary | ICD-10-CM

## 2013-11-08 NOTE — Progress Notes (Signed)
Patient ID: Tracy Bentley, male   DOB: 12-26-1965, 47 y.o.   MRN: [id number]          [facility]  Patient Active Problem List   Diagnosis Date Noted  . HIV INFECTION 03/16/2007    Priority: High  . Poor dentition 03/18/2011  . HEPATITIS B 03/29/2007  . 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 NUMBNESS 03/29/2007    Patient's Medications  New Prescriptions   No medications on file  Previous Medications   ALBUTEROL (PROVENTIL HFA;VENTOLIN HFA) 108 (90 BASE) MCG/ACT INHALER    Inhale 2 puffs into the lungs every 4 (four) hours as needed for wheezing.   EFAVIRENZ-EMTRICITABINE-TENOFOVIR (ATRIPLA) 600-200-300 MG PER TABLET    Take 1 tablet by mouth at bedtime.   SULFAMETHOXAZOLE-TRIMETHOPRIM (BACTRIM DS) 800-160 MG PER TABLET    Take 1 tablet by mouth daily.  Modified Medications   No medications on file  Discontinued Medications   AZITHROMYCIN (ZITHROMAX) 600 MG TABLET    Take 1 tablet (600 mg total) by mouth every 7 (seven) days.   FLUCONAZOLE (DIFLUCAN) 100 MG TABLET    Take 1 tablet (100 mg total) by mouth once a week.    Subjective: Tracy Bentley is in for his routine visit. He does not believe he's missed any doses of his Atripla. Review of Systems: A comprehensive review of systems was negative.  Past Medical History  Diagnosis Date  . HIV infection     History  Substance Use Topics  . Smoking status: Never Smoker   . Smokeless tobacco: Never Used  . Alcohol Use: 3.5 oz/week    7 drink(s) per week    No family history on file.  No Known Allergies  Objective: BP: 129/81 mmHg (11/18 0854) Pulse Rate: 56 (11/18 0854)  General: He is in good spirits Oral: No oropharyngeal lesions Skin: No rash Lungs: Clear Cor: Regular S1 and S2 no murmurs  Lab Results HIV 1 RNA Quant (copies/mL)  Date Value  10/24/2013 <20   07/11/2013 <20   04/28/2013 63*     CD4 T Cell Abs (/uL)  Date Value  10/24/2013 140*  07/11/2013 90*  04/28/2013 80*      Assessment: His HIV infection is under good control and a CD4 count slowly reconstituting.  Plan: 1. Continue Atripla 2. Discontinue fluconazole and azithromycin 3. Influenza vaccination 4. Followup after lab work in 6 months   Cliffton Asters, MD [facility] Disease [facility] [phone] pager   [phone] cell 11/08/2013, 9:12 AM

## 2013-11-08 NOTE — Telephone Encounter (Signed)
MD discontinued fluconazole and azithromycin today.  Pharmacist verbalized understanding.

## 2014-04-27 ENCOUNTER — Ambulatory Visit: Payer: Self-pay

## 2014-04-27 ENCOUNTER — Other Ambulatory Visit: Payer: Self-pay

## 2014-05-04 ENCOUNTER — Other Ambulatory Visit (INDEPENDENT_AMBULATORY_CARE_PROVIDER_SITE_OTHER): Payer: Self-pay

## 2014-05-04 ENCOUNTER — Encounter: Payer: Self-pay | Admitting: *Deleted

## 2014-05-04 ENCOUNTER — Other Ambulatory Visit: Payer: Self-pay

## 2014-05-04 DIAGNOSIS — Z113 Encounter for screening for infections with a predominantly sexual mode of transmission: Secondary | ICD-10-CM

## 2014-05-04 DIAGNOSIS — Z79899 Other long term (current) drug therapy: Secondary | ICD-10-CM

## 2014-05-04 DIAGNOSIS — B20 Human immunodeficiency virus [HIV] disease: Secondary | ICD-10-CM

## 2014-05-04 LAB — COMPREHENSIVE METABOLIC PANEL
ALT: 19 U/L (ref 0–53)
AST: 17 U/L (ref 0–37)
Albumin: 4.1 g/dL (ref 3.5–5.2)
Alkaline Phosphatase: 105 U/L (ref 39–117)
BUN: 13 mg/dL (ref 6–23)
CALCIUM: 9.1 mg/dL (ref 8.4–10.5)
CHLORIDE: 102 meq/L (ref 96–112)
CO2: 27 mEq/L (ref 19–32)
CREATININE: 1.05 mg/dL (ref 0.50–1.35)
Glucose, Bld: 81 mg/dL (ref 70–99)
Potassium: 4.3 mEq/L (ref 3.5–5.3)
Sodium: 135 mEq/L (ref 135–145)
Total Bilirubin: 0.4 mg/dL (ref 0.2–1.2)
Total Protein: 6.7 g/dL (ref 6.0–8.3)

## 2014-05-04 LAB — LIPID PANEL
CHOLESTEROL: 179 mg/dL (ref 0–200)
HDL: 50 mg/dL (ref >39)
LDL CALC: 108 mg/dL — AB (ref 0–99)
TRIGLYCERIDES: 105 mg/dL (ref <150)
Total CHOL/HDL Ratio: 3.6 Ratio
VLDL: 21 mg/dL (ref 0–40)

## 2014-05-04 LAB — CBC
HCT: 40 % (ref 39.0–52.0)
Hemoglobin: 14.4 g/dL (ref 13.0–17.0)
MCH: 31.9 pg (ref 26.0–34.0)
MCHC: 36 g/dL (ref 30.0–36.0)
MCV: 88.5 fL (ref 78.0–100.0)
PLATELETS: 203 10*3/uL (ref 150–400)
RBC: 4.52 MIL/uL (ref 4.22–5.81)
RDW: 13.6 % (ref 11.5–15.5)
WBC: 4.1 10*3/uL (ref 4.0–10.5)

## 2014-05-04 LAB — RPR

## 2014-05-05 ENCOUNTER — Other Ambulatory Visit: Payer: Self-pay | Admitting: Licensed Clinical Social Worker

## 2014-05-05 DIAGNOSIS — B20 Human immunodeficiency virus [HIV] disease: Secondary | ICD-10-CM

## 2014-05-05 LAB — T-HELPER CELL (CD4) - (RCID CLINIC ONLY)
CD4 % Helper T Cell: 15 % — ABNORMAL LOW (ref 33–55)
CD4 T CELL ABS: 250 /uL — AB (ref 400–2700)

## 2014-05-05 MED ORDER — EFAVIRENZ-EMTRICITAB-TENOFOVIR 600-200-300 MG PO TABS: 1.0000 | ORAL_TABLET | Freq: Every day | ORAL | Status: DC

## 2014-05-06 LAB — HIV-1 RNA QUANT-NO REFLEX-BLD

## 2014-05-08 ENCOUNTER — Telehealth: Payer: Self-pay | Admitting: *Deleted

## 2014-05-08 NOTE — Telephone Encounter (Signed)
Received a call from [name] needing help with his medication.  He has applied for ADAP but does not have enough medication to last.  I called [name] and got him an emergency supply.  Called and left a v/m asking him to call me back and get his information  ID [id number], Bin [id number], Group [phone].

## 2014-05-11 ENCOUNTER — Ambulatory Visit: Payer: Self-pay | Admitting: Internal Medicine

## 2014-05-23 ENCOUNTER — Ambulatory Visit (INDEPENDENT_AMBULATORY_CARE_PROVIDER_SITE_OTHER): Payer: Self-pay | Admitting: Internal Medicine

## 2014-05-23 VITALS — BP 126/87 | HR 56 | Temp 98.1°F | Wt 146.2 lb

## 2014-05-23 DIAGNOSIS — B20 Human immunodeficiency virus [HIV] disease: Secondary | ICD-10-CM

## 2014-05-23 DIAGNOSIS — Z23 Encounter for immunization: Secondary | ICD-10-CM

## 2014-05-23 NOTE — Progress Notes (Signed)
Patient ID: Tracy Bentley, male   DOB: 1966/01/10, 48 y.o.   MRN: [id number]          Patient Active Problem List   Diagnosis Date Noted  . HIV INFECTION 03/16/2007    Priority: High  . Poor dentition 03/18/2011  . HEPATITIS B 03/29/2007  . 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 NUMBNESS 03/29/2007    Patient's Medications  New Prescriptions   No medications on file  Previous Medications   ALBUTEROL (PROVENTIL HFA;VENTOLIN HFA) 108 (90 BASE) MCG/ACT INHALER    Inhale 2 puffs into the lungs every 4 (four) hours as needed for wheezing.   EFAVIRENZ-EMTRICITABINE-TENOFOVIR (ATRIPLA) 600-200-300 MG PER TABLET    Take 1 tablet by mouth at bedtime.   LORATADINE 10 MG CAPS    Take by mouth.  Modified Medications   No medications on file  Discontinued Medications   SULFAMETHOXAZOLE-TRIMETHOPRIM (BACTRIM DS) 800-160 MG PER TABLET    Take 1 tablet by mouth daily.   SULFAMETHOXAZOLE-TRIMETHOPRIM (SEPTRA DS) 800-160 MG PER TABLET    Take 1 tablet by mouth daily.    Subjective: Tracy Bentley is in for his routine visit. He is doing well without any complaints. He recently returned to school at [employer]. He hopes to transfer to [employer] in the fall. He has not yet certain what feel study he wants to pursue. He did let his ADAP certification lapse recently and was out of Atripla for one week. Otherwise he only notices doses rarely.  Review of Systems: Pertinent items are noted in HPI.  Past Medical History  Diagnosis Date  . HIV infection     History  Substance Use Topics  . Smoking status: Never Smoker   . Smokeless tobacco: Never Used  . Alcohol Use: 3.5 oz/week    7 drink(s) per week    No family history on file.  No Known Allergies  Objective: Temp: 98.1 F (36.7 C) (06/02 1501) Temp src: Oral (06/02 1501) BP: 126/87 mmHg (06/02 1501) Pulse Rate: 56 (06/02 1501) Body mass index is 23.62 kg/(m^2).  General: His weight is stable and his body mass index is 23 Oral: No oropharyngeal  lesions Skin: No rash Lungs: Clear Cor: Regular S1-S2 without murmurs Mood and affect: Normal and bright  Lab Results Lab Results  Component Value Date   WBC 4.1 05/04/2014   HGB 14.4 05/04/2014   HCT 40.0 05/04/2014   MCV 88.5 05/04/2014   PLT 203 05/04/2014    Lab Results  Component Value Date   CREATININE 1.05 05/04/2014   BUN 13 05/04/2014   NA 135 05/04/2014   K 4.3 05/04/2014   CL 102 05/04/2014   CO2 27 05/04/2014    Lab Results  Component Value Date   ALT 19 05/04/2014   AST 17 05/04/2014   ALKPHOS 105 05/04/2014   BILITOT 0.4 05/04/2014    Lab Results  Component Value Date   CHOL 179 05/04/2014   HDL 50 05/04/2014   LDLCALC 108* 05/04/2014   TRIG 105 05/04/2014   CHOLHDL 3.6 05/04/2014    Lab Results HIV 1 RNA Quant (copies/mL)  Date Value  05/04/2014 <20   10/24/2013 <20   07/11/2013 <20      CD4 T Cell Abs (/uL)  Date Value  05/04/2014 250*  10/24/2013 140*  07/11/2013 90*     Assessment: His HIV infection remains under very good control he has had good CD4 reconstitution since restarting therapy 2 years ago. I will continue Atripla. He can stop  trimethoprim sulfamethoxazole now.  Plan: 1. Continue Atripla 2. Discontinue trimethoprim-sulfamethoxazole 3. I reminded him to renew his ADAP certification for rate January and July 4. Follow up after lab work in 6 months   Cliffton AstersJohn Jadan Rouillard, MD [facility] [phone] pager   [phone] cell 05/23/2014, 3:30 PM

## 2014-06-05 ENCOUNTER — Other Ambulatory Visit: Payer: Self-pay | Admitting: *Deleted

## 2014-06-05 DIAGNOSIS — B20 Human immunodeficiency virus [HIV] disease: Secondary | ICD-10-CM

## 2014-06-05 MED ORDER — EFAVIRENZ-EMTRICITAB-TENOFOVIR 600-200-300 MG PO TABS: 1.0000 | ORAL_TABLET | Freq: Every day | ORAL | Status: DC

## 2014-06-23 IMAGING — CR DG CHEST 2V
2 series · 2 of 2 positions shown · non-contrast
Comparison: None.

CLINICAL DATA: History of coughing, dyspnea, and HIV.  3-week
history of coughing and congestion with intermittent fever.  No
history of respiratory disease.

CHEST - 2 VIEW

[view not recorded (1 of 2)]
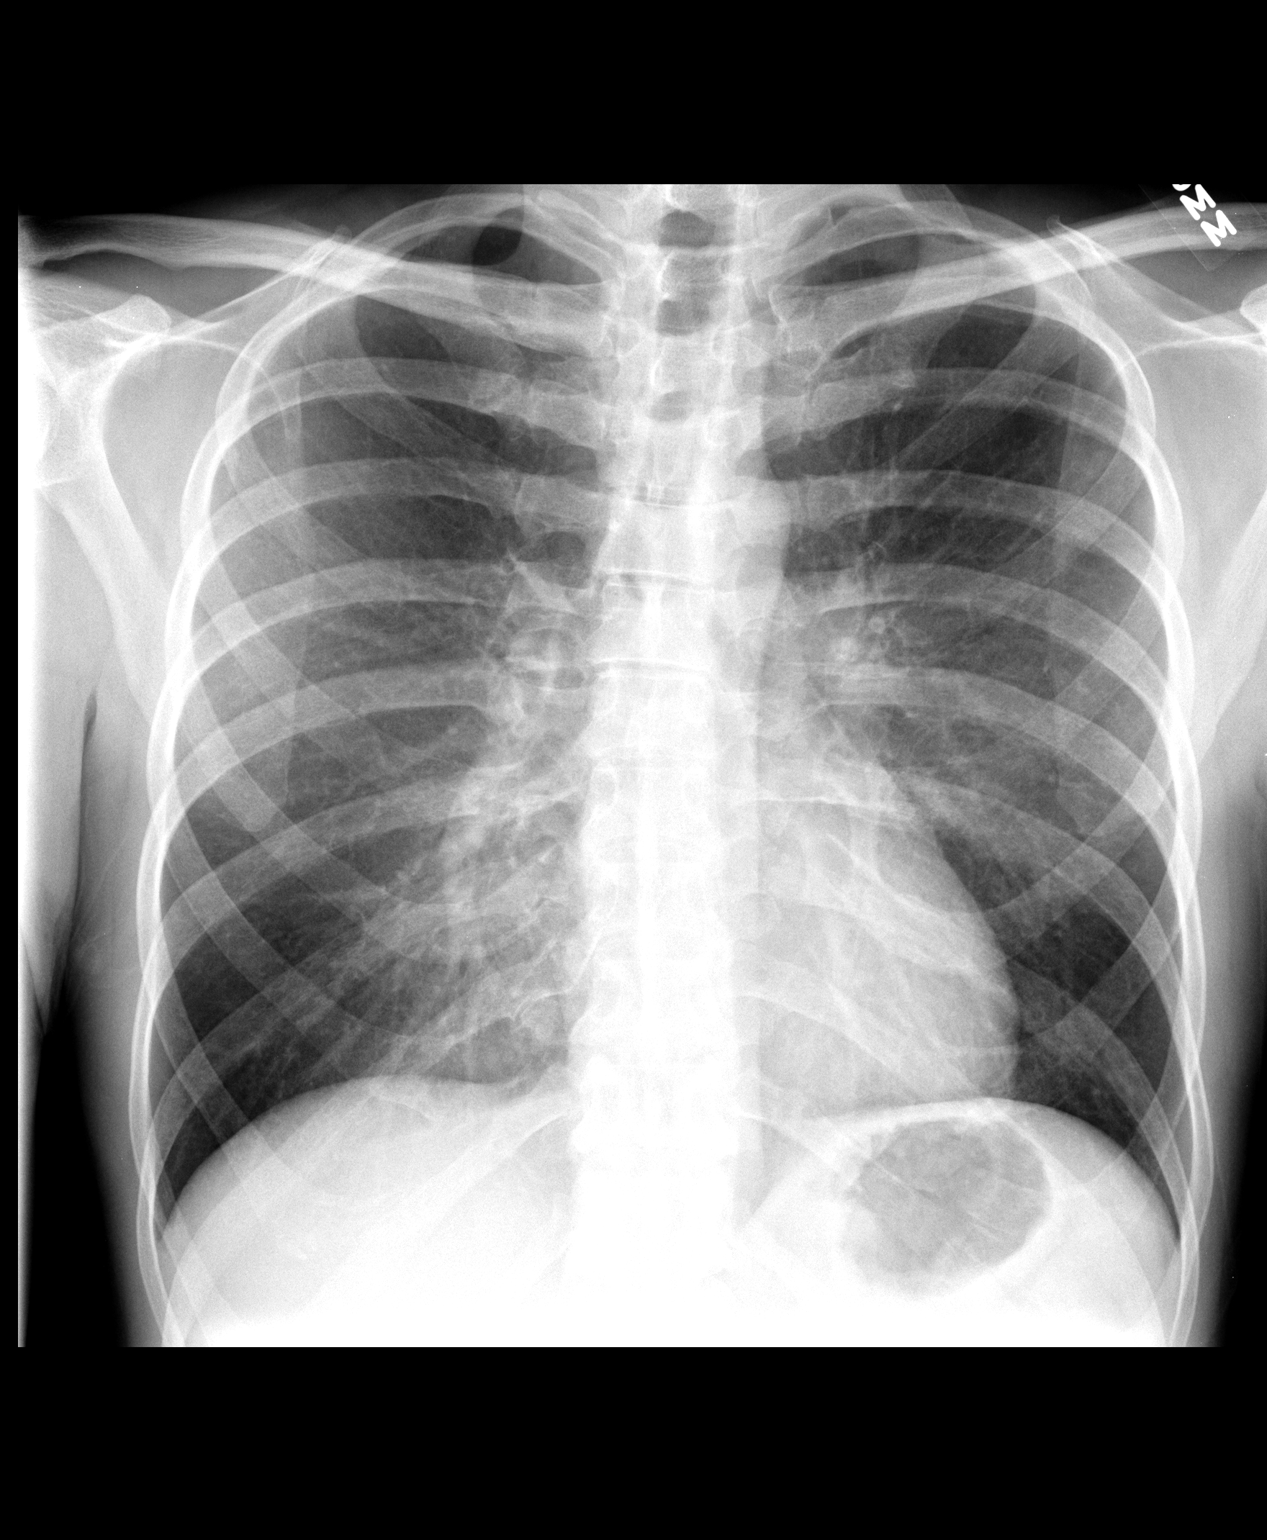

[view not recorded (2 of 2)]
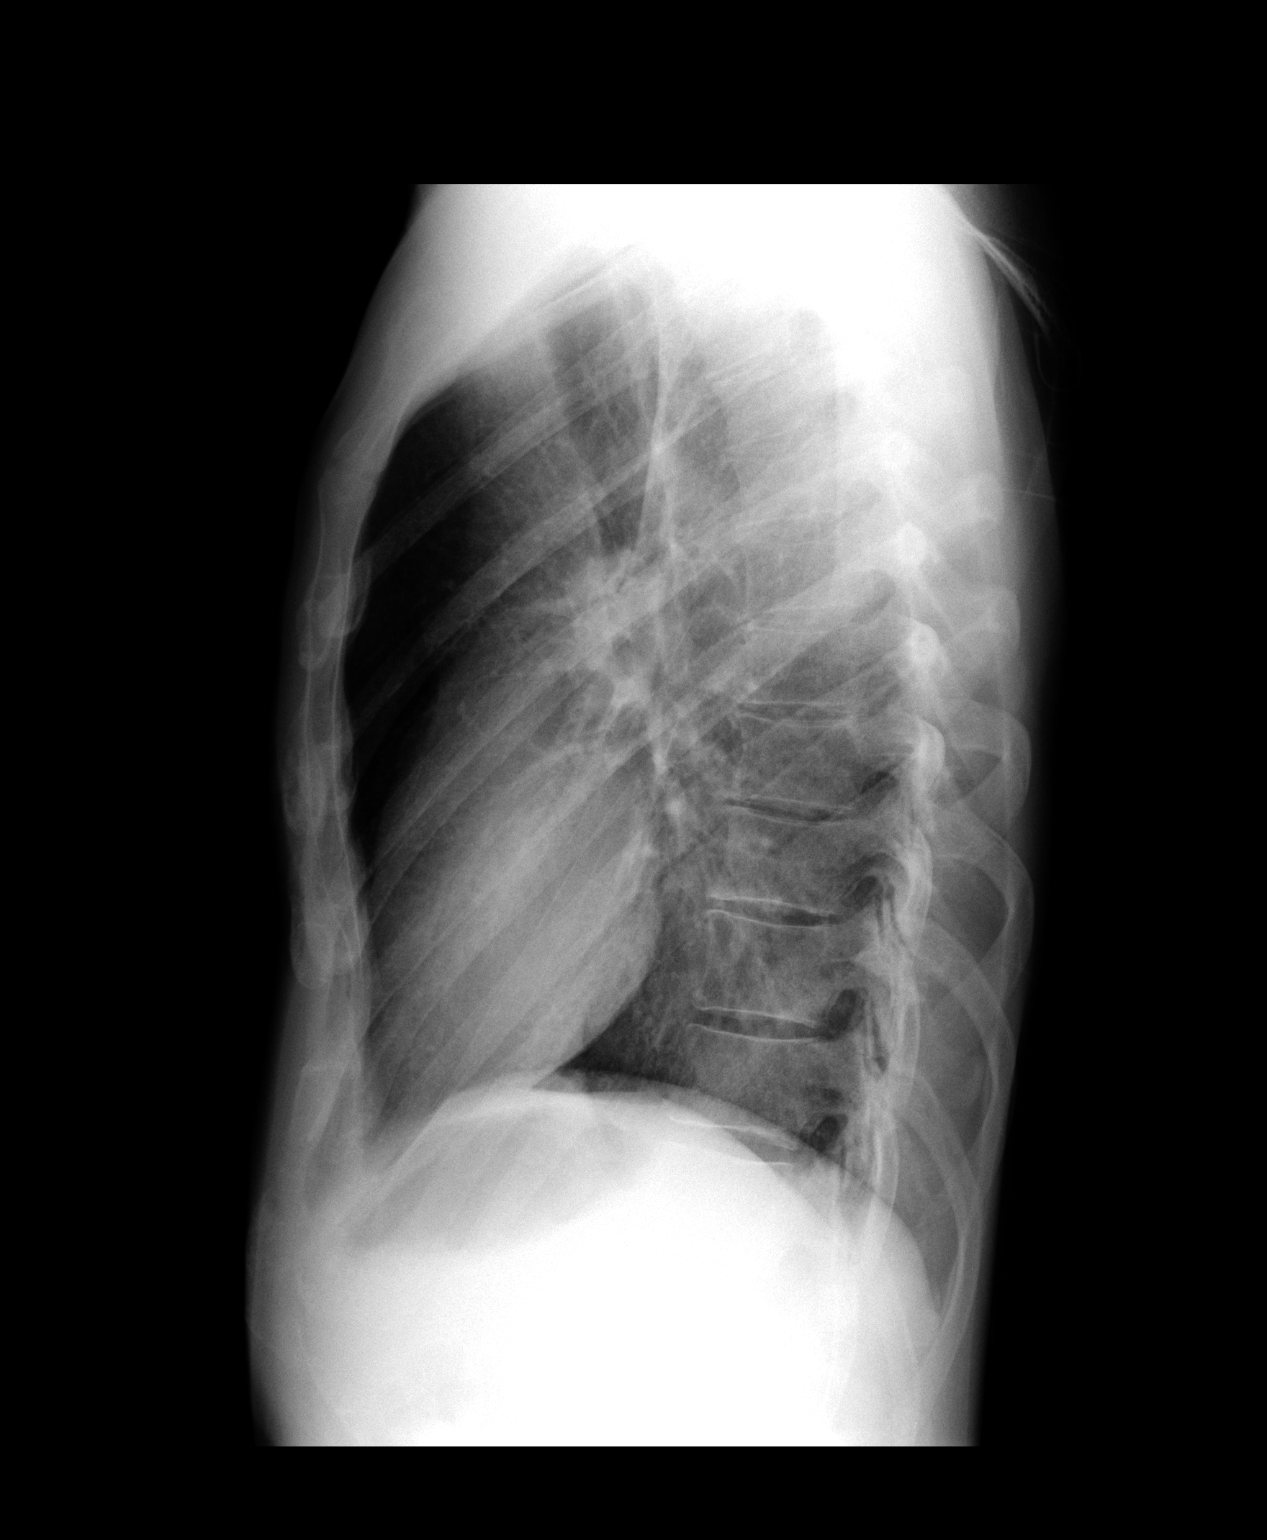

[2 of 2 positions shown; findings below may reference images not displayed]

FINDINGS: Cardiac silhouette is upper normal size.  There is slight
accentuation by pectus excavatum.  No hilar or mediastinal
enlargement is seen.  No pulmonary edema, pneumonia, or pleural
effusion is seen.  Minimal central peribronchial thickening is
present.  There is slight scoliosis convexity to the right.
IMPRESSION: No pulmonary edema, pneumonia, or pleural effusion is evident.
Minimal central peribronchial thickening is present.  Slight
scoliosis.

## 2014-09-19 ENCOUNTER — Other Ambulatory Visit: Payer: Self-pay | Admitting: *Deleted

## 2014-09-19 ENCOUNTER — Ambulatory Visit: Payer: Self-pay

## 2014-09-19 DIAGNOSIS — B20 Human immunodeficiency virus [HIV] disease: Secondary | ICD-10-CM

## 2014-09-19 MED ORDER — EFAVIRENZ-EMTRICITAB-TENOFOVIR 600-200-300 MG PO TABS: 1.0000 | ORAL_TABLET | Freq: Every day | ORAL | Status: DC

## 2014-09-19 NOTE — Telephone Encounter (Signed)
ADAP Application 

## 2014-11-09 ENCOUNTER — Other Ambulatory Visit (INDEPENDENT_AMBULATORY_CARE_PROVIDER_SITE_OTHER): Payer: Self-pay

## 2014-11-09 DIAGNOSIS — B20 Human immunodeficiency virus [HIV] disease: Secondary | ICD-10-CM

## 2014-11-10 LAB — T-HELPER CELL (CD4) - (RCID CLINIC ONLY)
CD4 % Helper T Cell: 15 % — ABNORMAL LOW (ref 33–55)
CD4 T Cell Abs: 300 /uL — ABNORMAL LOW (ref 400–2700)

## 2014-11-10 LAB — HIV-1 RNA QUANT-NO REFLEX-BLD: HIV 1 RNA Quant: <20 copies/mL (ref <20)

## 2014-11-23 ENCOUNTER — Ambulatory Visit: Payer: Self-pay | Admitting: Internal Medicine

## 2014-11-23 ENCOUNTER — Ambulatory Visit (INDEPENDENT_AMBULATORY_CARE_PROVIDER_SITE_OTHER): Payer: Self-pay | Admitting: *Deleted

## 2014-11-23 ENCOUNTER — Ambulatory Visit (INDEPENDENT_AMBULATORY_CARE_PROVIDER_SITE_OTHER): Payer: Self-pay | Admitting: Internal Medicine

## 2014-11-23 ENCOUNTER — Encounter: Payer: Self-pay | Admitting: Internal Medicine

## 2014-11-23 DIAGNOSIS — B20 Human immunodeficiency virus [HIV] disease: Secondary | ICD-10-CM

## 2014-11-23 DIAGNOSIS — Z23 Encounter for immunization: Secondary | ICD-10-CM

## 2014-11-23 NOTE — Addendum Note (Signed)
Addended by: Lurlean LeydenPOOLE, Latamara Melder F on: 11/23/2014 02:12 PM   Modules accepted: Orders

## 2014-11-23 NOTE — Progress Notes (Signed)
Patient ID: Tracy Bentley, male   DOB: Jun 25, 1966, 48 y.o.   MRN: [id number]          Patient Active Problem List   Diagnosis Date Noted  . Human immunodeficiency virus (HIV) disease 03/16/2007    Priority: High  . Poor dentition 03/18/2011  . HEPATITIS B 03/29/2007  . 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 NUMBNESS 03/29/2007    Patient's Medications  New Prescriptions   No medications on file  Previous Medications   ALBUTEROL (PROVENTIL HFA;VENTOLIN HFA) 108 (90 BASE) MCG/ACT INHALER    Inhale 2 puffs into the lungs every 4 (four) hours as needed for wheezing.   EFAVIRENZ-EMTRICITABINE-TENOFOVIR (ATRIPLA) 600-200-300 MG PER TABLET    Take 1 tablet by mouth at bedtime.   LORATADINE 10 MG CAPS    Take by mouth.  Modified Medications   No medications on file  Discontinued Medications   No medications on file    Subjective: Tracy Bentley is in for his routine visit. He continues to take Atripla without any difficulty. He thinks he may miss one dose each month when he falls asleep before taking it. He is not having any current health problems. He remains in school at [employer] studying education. He hopes to become a Runner, broadcasting/film/videoteacher. Review of Systems: Pertinent items are noted in HPI.  Past Medical History  Diagnosis Date  . HIV infection     History  Substance Use Topics  . Smoking status: Never Smoker   . Smokeless tobacco: Never Used  . Alcohol Use: 3.5 oz/week    7 drink(s) per week    No family history on file.  No Known Allergies  Objective: Temp: 97.8 F (36.6 C) (12/03 1340) Temp Source: Oral (12/03 1340) BP: 128/79 mmHg (12/03 1340) Pulse Rate: 75 (12/03 1340) Body mass index is 24.55 kg/(m^2).  General: He is in no distress Oral: No oropharyngeal lesions Skin: No rash Lungs: Clear Cor: Regular S1 and S2 with no murmur  Lab Results Lab Results  Component Value Date   WBC 4.1 05/04/2014   HGB 14.4 05/04/2014   HCT 40.0 05/04/2014   MCV 88.5 05/04/2014   PLT 203  05/04/2014    Lab Results  Component Value Date   CREATININE 1.05 05/04/2014   BUN 13 05/04/2014   NA 135 05/04/2014   K 4.3 05/04/2014   CL 102 05/04/2014   CO2 27 05/04/2014    Lab Results  Component Value Date   ALT 19 05/04/2014   AST 17 05/04/2014   ALKPHOS 105 05/04/2014   BILITOT 0.4 05/04/2014    Lab Results  Component Value Date   CHOL 179 05/04/2014   HDL 50 05/04/2014   LDLCALC 108* 05/04/2014   TRIG 105 05/04/2014   CHOLHDL 3.6 05/04/2014    Lab Results HIV 1 RNA QUANT (copies/mL)  Date Value  11/09/2014 <20  05/04/2014 <20  10/24/2013 <20   CD4 T CELL ABS (/uL)  Date Value  11/09/2014 300*  05/04/2014 250*  10/24/2013 140*     Assessment: He has had slow but steady CD4 reconstitution and excellent viral suppression since restarting antiretroviral therapy 2 years ago. I encouraged him to set his alarm and try not to miss any doses of his Atripla.  Plan: 1. Continue Atripla 2. Recertified for ADAP in January 3. Influenza vaccination 4. Start hepatitis A vaccination 5. Follow-up in 6 months   Cliffton AstersJohn Kerwin Augustus, MD [facility] [phone] pager   [phone] cell  11/23/2014, 1:53 PM

## 2015-04-09 ENCOUNTER — Ambulatory Visit: Payer: Self-pay

## 2015-04-11 ENCOUNTER — Other Ambulatory Visit: Payer: Self-pay | Admitting: *Deleted

## 2015-04-11 DIAGNOSIS — B20 Human immunodeficiency virus [HIV] disease: Secondary | ICD-10-CM

## 2015-04-11 MED ORDER — EFAVIRENZ-EMTRICITAB-TENOFOVIR 600-200-300 MG PO TABS: 1.0000 | ORAL_TABLET | Freq: Every day | ORAL | Status: DC

## 2015-04-11 NOTE — Telephone Encounter (Signed)
ADAP Application 

## 2015-05-24 ENCOUNTER — Other Ambulatory Visit: Payer: Self-pay

## 2015-05-24 DIAGNOSIS — B20 Human immunodeficiency virus [HIV] disease: Secondary | ICD-10-CM

## 2015-05-24 LAB — COMPREHENSIVE METABOLIC PANEL
ALBUMIN: 4.2 g/dL (ref 3.5–5.2)
ALK PHOS: 83 U/L (ref 39–117)
ALT: 27 U/L (ref 0–53)
AST: 25 U/L (ref 0–37)
BUN: 16 mg/dL (ref 6–23)
CHLORIDE: 105 meq/L (ref 96–112)
CO2: 24 mEq/L (ref 19–32)
CREATININE: 1.24 mg/dL (ref 0.50–1.35)
Calcium: 9 mg/dL (ref 8.4–10.5)
GLUCOSE: 93 mg/dL (ref 70–99)
POTASSIUM: 4.4 meq/L (ref 3.5–5.3)
Sodium: 136 mEq/L (ref 135–145)
TOTAL PROTEIN: 6.7 g/dL (ref 6.0–8.3)
Total Bilirubin: 0.5 mg/dL (ref 0.2–1.2)

## 2015-05-24 LAB — CBC
HEMATOCRIT: 44.1 % (ref 39.0–52.0)
HEMOGLOBIN: 15.2 g/dL (ref 13.0–17.0)
MCH: 31 pg (ref 26.0–34.0)
MCHC: 34.5 g/dL (ref 30.0–36.0)
MCV: 90 fL (ref 78.0–100.0)
MPV: 9.3 fL (ref 8.6–12.4)
Platelets: 220 10*3/uL (ref 150–400)
RBC: 4.9 MIL/uL (ref 4.22–5.81)
RDW: 13.7 % (ref 11.5–15.5)
WBC: 4.4 10*3/uL (ref 4.0–10.5)

## 2015-05-24 LAB — LIPID PANEL
Cholesterol: 188 mg/dL (ref 0–200)
HDL: 52 mg/dL (ref >=40)
LDL CALC: 112 mg/dL — AB (ref 0–99)
Total CHOL/HDL Ratio: 3.6 Ratio
Triglycerides: 118 mg/dL (ref <150)
VLDL: 24 mg/dL (ref 0–40)

## 2015-05-25 LAB — T-HELPER CELL (CD4) - (RCID CLINIC ONLY)
CD4 T CELL ABS: 260 /uL — AB (ref 400–2700)
CD4 T CELL HELPER: 15 % — AB (ref 33–55)

## 2015-05-25 LAB — HIV-1 RNA QUANT-NO REFLEX-BLD: HIV-1 RNA Quant, Log: <1.3 {Log} (ref <1.30)

## 2015-05-25 LAB — RPR

## 2015-06-07 ENCOUNTER — Ambulatory Visit (INDEPENDENT_AMBULATORY_CARE_PROVIDER_SITE_OTHER): Payer: Self-pay | Admitting: Internal Medicine

## 2015-06-07 ENCOUNTER — Encounter: Payer: Self-pay | Admitting: Internal Medicine

## 2015-06-07 DIAGNOSIS — B20 Human immunodeficiency virus [HIV] disease: Secondary | ICD-10-CM

## 2015-06-07 MED ORDER — ELVITEG-COBIC-EMTRICIT-TENOFAF 150-150-200-10 MG PO TABS: 1.0000 | ORAL_TABLET | Freq: Every day | ORAL | Status: DC

## 2015-06-07 NOTE — Progress Notes (Signed)
Patient ID: Tracy Bentley, male   DOB: October 02, 1966, 49 y.o.   MRN: [id number]          Patient Active Problem List   Diagnosis Date Noted  . Human immunodeficiency virus (HIV) disease 03/16/2007    Priority: High  . Poor dentition 03/18/2011  . HEPATITIS B 03/29/2007  . 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 NUMBNESS 03/29/2007    Patient's Medications  New Prescriptions   ELVITEGRAVIR-COBICISTAT-EMTRICITABINE-TENOFOVIR (GENVOYA) 150-150-200-10 MG TABS TABLET    Take 1 tablet by mouth daily with breakfast.  Previous Medications   ALBUTEROL (PROVENTIL HFA;VENTOLIN HFA) 108 (90 BASE) MCG/ACT INHALER    Inhale 2 puffs into the lungs every 4 (four) hours as needed for wheezing.   EFAVIRENZ-EMTRICITABINE-TENOFOVIR (ATRIPLA) 600-200-300 MG PER TABLET    Take 1 tablet by mouth at bedtime.   LORATADINE 10 MG CAPS    Take by mouth.  Modified Medications   No medications on file  Discontinued Medications   No medications on file    Subjective: Tracy Bentley is in for his routine follow-up visit. He estimates that he is missed Atripla only 2 or 3 times in the past 3 months. That generally occurred when he was out of town and his routine was disrupted. He does not have any problems tolerating it. He is not on any other medications at this time. He is still in school and has about 2 and half years left before he graduates.  Review of Systems: Pertinent items are noted in HPI.  Past Medical History  Diagnosis Date  . HIV infection     History  Substance Use Topics  . Smoking status: Never Smoker   . Smokeless tobacco: Never Used  . Alcohol Use: 4.2 oz/week    7 Standard drinks or equivalent per week    No family history on file.  No Known Allergies  Objective: Temp: 97.9 F (36.6 C) (06/16 1507) Temp Source: Oral (06/16 1507) BP: 136/89 mmHg (06/16 1507) Pulse Rate: 57 (06/16 1507) Body mass index is 25.03 kg/(m^2).  General: He is smiling and in good spirits Skin: He has a few  excoriated bug bites on his right arm and right leg Lungs: Clear Cor: Regular S1 and S2 with no murmurs  Lab Results Lab Results  Component Value Date   WBC 4.4 05/24/2015   HGB 15.2 05/24/2015   HCT 44.1 05/24/2015   MCV 90.0 05/24/2015   PLT 220 05/24/2015    Lab Results  Component Value Date   CREATININE 1.24 05/24/2015   BUN 16 05/24/2015   NA 136 05/24/2015   K 4.4 05/24/2015   CL 105 05/24/2015   CO2 24 05/24/2015    Lab Results  Component Value Date   ALT 27 05/24/2015   AST 25 05/24/2015   ALKPHOS 83 05/24/2015   BILITOT 0.5 05/24/2015    Lab Results  Component Value Date   CHOL 188 05/24/2015   HDL 52 05/24/2015   LDLCALC 112* 05/24/2015   TRIG 118 05/24/2015   CHOLHDL 3.6 05/24/2015    Lab Results HIV 1 RNA QUANT (copies/mL)  Date Value  05/24/2015 <20  11/09/2014 <20  05/04/2014 <20   CD4 T CELL ABS (/uL)  Date Value  05/24/2015 260*  11/09/2014 300*  05/04/2014 250*     Assessment: His HIV infection is under good control and he has had slow but steady CD4 reconstitution since restarting therapy 3 years ago. I talked to him about newer one pill a day regimens.  He agrees to a switch to [employer]. He will take it with his dinner meal.  Plan: 1. Change Atripla to Genvoya 2. Follow-up after lab work in 6 months   Cliffton Asters, MD [facility] for Infectious Disease [facility] [phone] pager   [phone] cell 06/07/2015, 3:47 PM

## 2015-06-28 ENCOUNTER — Other Ambulatory Visit: Payer: Self-pay | Admitting: Internal Medicine

## 2015-07-16 ENCOUNTER — Telehealth: Payer: Self-pay | Admitting: Licensed Clinical Social Worker

## 2015-07-16 NOTE — Telephone Encounter (Signed)
Patient called stating that he needs a second TDAP and MMR to get in [place]. They will not release his financial aid if he does not show proof of the vaccines or gets them again. Patient received these vaccines a long time ago and the doctor has since stopped practicing. Can the patient come here and have it done.  Typically we advise our patient to go to the Health Department, but the patient states he does not have money. Please advise

## 2015-07-16 NOTE — Telephone Encounter (Signed)
He can get them here.

## 2015-07-17 NOTE — Telephone Encounter (Signed)
I left a message on the voicemail that he can schedule an appointment.

## 2015-07-18 ENCOUNTER — Ambulatory Visit (INDEPENDENT_AMBULATORY_CARE_PROVIDER_SITE_OTHER): Payer: Self-pay | Admitting: *Deleted

## 2015-07-18 ENCOUNTER — Other Ambulatory Visit: Payer: Self-pay

## 2015-07-18 DIAGNOSIS — Z1159 Encounter for screening for other viral diseases: Secondary | ICD-10-CM

## 2015-07-18 DIAGNOSIS — Z23 Encounter for immunization: Secondary | ICD-10-CM

## 2015-07-18 LAB — RUBEOLA ANTIBODY IGG: Rubeola IgG: 13.9 AU/mL (ref <25.00)

## 2015-07-18 NOTE — Patient Instructions (Addendum)
Your Measles Antibody lab test will be available for viewing through MyChart tomorrow.  I will print it out for your pick up tomorrow at [facility].

## 2015-07-23 ENCOUNTER — Telehealth: Payer: Self-pay | Admitting: *Deleted

## 2015-07-23 NOTE — Telephone Encounter (Signed)
Left message asking if patient renewed his ADAP while getting immunizations last week. Notifed patient that he should come in this week to ensure that his application is sent to [facility] with enough time to be processed and approved before the 9/30 deadline.  Gave phone numbers to Clinic and [facility] for scheduling. Andree Coss, RN

## 2015-09-03 ENCOUNTER — Other Ambulatory Visit: Payer: Self-pay | Admitting: *Deleted

## 2015-09-03 DIAGNOSIS — B20 Human immunodeficiency virus [HIV] disease: Secondary | ICD-10-CM

## 2015-09-03 MED ORDER — ELVITEG-COBIC-EMTRICIT-TENOFAF 150-150-200-10 MG PO TABS: 1.0000 | ORAL_TABLET | Freq: Every day | ORAL | Status: AC

## 2015-11-26 ENCOUNTER — Other Ambulatory Visit: Payer: Self-pay

## 2015-12-10 ENCOUNTER — Ambulatory Visit: Payer: Self-pay | Admitting: Internal Medicine

## 2015-12-26 ENCOUNTER — Other Ambulatory Visit: Payer: Self-pay

## 2015-12-26 ENCOUNTER — Ambulatory Visit: Payer: Self-pay

## 2015-12-26 DIAGNOSIS — Z23 Encounter for immunization: Secondary | ICD-10-CM

## 2015-12-26 DIAGNOSIS — B20 Human immunodeficiency virus [HIV] disease: Secondary | ICD-10-CM

## 2015-12-26 LAB — CBC
HEMATOCRIT: 45.3 % (ref 39.0–52.0)
HEMOGLOBIN: 15.4 g/dL (ref 13.0–17.0)
MCH: 31.4 pg (ref 26.0–34.0)
MCHC: 34 g/dL (ref 30.0–36.0)
MCV: 92.3 fL (ref 78.0–100.0)
MPV: 10 fL (ref 8.6–12.4)
Platelets: 251 10*3/uL (ref 150–400)
RBC: 4.91 MIL/uL (ref 4.22–5.81)
RDW: 13.8 % (ref 11.5–15.5)
WBC: 5.3 10*3/uL (ref 4.0–10.5)

## 2015-12-26 LAB — COMPREHENSIVE METABOLIC PANEL
ALK PHOS: 69 U/L (ref 40–115)
ALT: 23 U/L (ref 9–46)
AST: 19 U/L (ref 10–40)
Albumin: 4.6 g/dL (ref 3.6–5.1)
BILIRUBIN TOTAL: 0.8 mg/dL (ref 0.2–1.2)
BUN: 14 mg/dL (ref 7–25)
CALCIUM: 10 mg/dL (ref 8.6–10.3)
CO2: 29 mmol/L (ref 20–31)
Chloride: 103 mmol/L (ref 98–110)
Creat: 1.16 mg/dL (ref 0.60–1.35)
GLUCOSE: 85 mg/dL (ref 65–99)
Potassium: 4.6 mmol/L (ref 3.5–5.3)
Sodium: 139 mmol/L (ref 135–146)
Total Protein: 7.4 g/dL (ref 6.1–8.1)

## 2015-12-27 LAB — T-HELPER CELL (CD4) - (RCID CLINIC ONLY)
CD4 % Helper T Cell: 17 % — ABNORMAL LOW (ref 33–55)
CD4 T Cell Abs: 420 /uL (ref 400–2700)

## 2015-12-28 LAB — HIV-1 RNA QUANT-NO REFLEX-BLD: HIV-1 RNA Quant, Log: <1.3 Log copies/mL (ref <1.30)

## 2016-01-16 ENCOUNTER — Ambulatory Visit (INDEPENDENT_AMBULATORY_CARE_PROVIDER_SITE_OTHER): Payer: BLUE CROSS/BLUE SHIELD | Admitting: Internal Medicine

## 2016-01-16 ENCOUNTER — Encounter: Payer: Self-pay | Admitting: Internal Medicine

## 2016-01-16 DIAGNOSIS — B20 Human immunodeficiency virus [HIV] disease: Secondary | ICD-10-CM | POA: Diagnosis not present

## 2016-01-16 NOTE — Assessment & Plan Note (Signed)
His HIV infection has come under excellent control and he has had steady CD4 reconstitution since restarting therapy 4 years ago. I will continue Genvoya and he will follow-up in 6 months.  He has a simple upper respiratory infection. I suggested that he use generic Afrin nasal spray for the next 3-4 days.

## 2016-01-16 NOTE — Progress Notes (Signed)
Patient Active Problem List   Diagnosis Date Noted  . Human immunodeficiency virus (HIV) disease (HCC) 03/16/2007    Priority: High  . Poor dentition 03/18/2011  . HEPATITIS B 03/29/2007  . 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 NUMBNESS 03/29/2007    Patient's Medications  New Prescriptions   No medications on file  Previous Medications   ELVITEGRAVIR-COBICISTAT-EMTRICITABINE-TENOFOVIR (GENVOYA) 150-150-200-10 MG TABS TABLET    Take 1 tablet by mouth daily with breakfast.   LORATADINE 10 MG CAPS    Take by mouth.  Modified Medications   No medications on file  Discontinued Medications   ALBUTEROL (PROVENTIL HFA;VENTOLIN HFA) 108 (90 BASE) MCG/ACT INHALER    Inhale 2 puffs into the lungs every 4 (four) hours as needed for wheezing.    Subjective: Tracy Bentley is in for his routine HIV follow-up visit. He has been taking his Genvoya with his evening meal. He has no problems tolerating it and has missed only one dose in the past 6 months. This occurred when he simply forgot to take it. He continues with his schoolwork. He has been feeling well up until about 3 days ago when he developed a cold with sinus congestion.   Review of Systems: Review of Systems  Constitutional: Positive for malaise/fatigue. Negative for fever, chills, weight loss and diaphoresis.  HENT: Positive for congestion. Negative for sore throat.   Respiratory: Positive for cough and sputum production. Negative for shortness of breath and wheezing.   Cardiovascular: Negative for chest pain.  Gastrointestinal: Negative for nausea, vomiting and diarrhea.  Genitourinary: Negative for dysuria.  Musculoskeletal: Negative for myalgias and joint pain.  Skin: Negative for rash.  Neurological: Negative for headaches.  Psychiatric/Behavioral: Negative for depression and substance abuse. The patient is not nervous/anxious.     Past Medical History  Diagnosis Date  . HIV infection [facility])     Social History  Substance Use  Topics  . Smoking status: Never Smoker   . Smokeless tobacco: Never Used  . Alcohol Use: 4.2 oz/week    7 Standard drinks or equivalent per week    No family history on file.  No Known Allergies  Objective:  Filed Vitals:   01/16/16 1613  BP: 143/82  Pulse: 82  Temp: 98.3 F (36.8 C)  TempSrc: Oral  Height:  (1.676 m)  Weight: 149 lb (67.586 kg)   Body mass index is 24.06 kg/(m^2).  Physical Exam  Constitutional: He is oriented to person, place, and time.  He sounds congested.  HENT:  Mouth/Throat: No oropharyngeal exudate.  Eyes: Conjunctivae are normal.  Cardiovascular: Normal rate and regular rhythm.   No murmur heard. Pulmonary/Chest: Effort normal and breath sounds normal. He has no wheezes. He has no rales.  Abdominal: Soft. He exhibits no mass. There is no tenderness.  Musculoskeletal: Normal range of motion.  Neurological: He is alert and oriented to person, place, and time.  Skin: No rash noted.  Psychiatric: Mood and affect normal.    Lab Results Lab Results  Component Value Date   WBC 5.3 12/26/2015   HGB 15.4 12/26/2015   HCT 45.3 12/26/2015   MCV 92.3 12/26/2015   PLT 251 12/26/2015    Lab Results  Component Value Date   CREATININE 1.16 12/26/2015   BUN 14 12/26/2015   NA 139 12/26/2015   K 4.6 12/26/2015   CL 103 12/26/2015   CO2 29 12/26/2015    Lab Results  Component Value Date  ALT 23 12/26/2015   AST 19 12/26/2015   ALKPHOS 69 12/26/2015   BILITOT 0.8 12/26/2015    Lab Results  Component Value Date   CHOL 188 05/24/2015   HDL 52 05/24/2015   LDLCALC 112* 05/24/2015   TRIG 118 05/24/2015   CHOLHDL 3.6 05/24/2015    Lab Results HIV 1 RNA QUANT (copies/mL)  Date Value  12/26/2015 <20  05/24/2015 <20  11/09/2014 <20   CD4 T CELL ABS (/uL)  Date Value  12/26/2015 420  05/24/2015 260*  11/09/2014 300*      Problem List Items Addressed This Visit      High   Human immunodeficiency virus (HIV) disease (HCC)     His HIV infection has come under excellent control and he has had steady CD4 reconstitution since restarting therapy 4 years ago. I will continue Genvoya and he will follow-up in 6 months.  He has a simple upper respiratory infection. I suggested that he use generic Afrin nasal spray for the next 3-4 days.      Relevant Orders   T-helper cell (CD4)- ([facility] clinic only)   HIV 1 RNA quant-no reflex-bld   CBC   Lipid panel   RPR   Comprehensive metabolic panel   Hepatitis B surface antibody   Hepatitis B surface antigen   Hepatitis B DNA, ultraquantitative, PCR        Tracy Asters, MD [facility] for Infectious Disease [facility] [phone] pager   [phone] cell 01/16/2016, 5:19 PM

## 2017-05-07 ENCOUNTER — Other Ambulatory Visit: Payer: BLUE CROSS/BLUE SHIELD

## 2017-05-07 DIAGNOSIS — Z113 Encounter for screening for infections with a predominantly sexual mode of transmission: Secondary | ICD-10-CM

## 2017-05-07 DIAGNOSIS — Z79899 Other long term (current) drug therapy: Secondary | ICD-10-CM

## 2017-05-07 DIAGNOSIS — B20 Human immunodeficiency virus [HIV] disease: Secondary | ICD-10-CM

## 2017-05-07 LAB — CBC WITH DIFFERENTIAL/PLATELET
BASOS PCT: 0 %
Basophils Absolute: 0 cells/uL (ref 0–200)
Eosinophils Absolute: 100 cells/uL (ref 15–500)
Eosinophils Relative: 2 %
HEMATOCRIT: 44.2 % (ref 38.5–50.0)
HEMOGLOBIN: 14.8 g/dL (ref 13.2–17.1)
LYMPHS ABS: 2200 {cells}/uL (ref 850–3900)
Lymphocytes Relative: 44 %
MCH: 30.8 pg (ref 27.0–33.0)
MCHC: 33.5 g/dL (ref 32.0–36.0)
MCV: 92.1 fL (ref 80.0–100.0)
MONO ABS: 500 {cells}/uL (ref 200–950)
MPV: 9.7 fL (ref 7.5–12.5)
Monocytes Relative: 10 %
NEUTROS ABS: 2200 {cells}/uL (ref 1500–7800)
Neutrophils Relative %: 44 %
PLATELETS: 215 10*3/uL (ref 140–400)
RBC: 4.8 MIL/uL (ref 4.20–5.80)
RDW: 14.2 % (ref 11.0–15.0)
WBC: 5 10*3/uL (ref 3.8–10.8)

## 2017-05-07 LAB — COMPREHENSIVE METABOLIC PANEL
ALK PHOS: 70 U/L (ref 40–115)
ALT: 26 U/L (ref 9–46)
AST: 23 U/L (ref 10–35)
Albumin: 4.4 g/dL (ref 3.6–5.1)
BILIRUBIN TOTAL: 0.9 mg/dL (ref 0.2–1.2)
BUN: 14 mg/dL (ref 7–25)
CALCIUM: 9.5 mg/dL (ref 8.6–10.3)
CO2: 23 mmol/L (ref 20–31)
CREATININE: 1.16 mg/dL (ref 0.70–1.33)
Chloride: 106 mmol/L (ref 98–110)
Glucose, Bld: 87 mg/dL (ref 65–99)
Potassium: 4.5 mmol/L (ref 3.5–5.3)
Sodium: 140 mmol/L (ref 135–146)
Total Protein: 6.8 g/dL (ref 6.1–8.1)

## 2017-05-07 LAB — LIPID PANEL
CHOLESTEROL: 206 mg/dL — AB (ref <200)
HDL: 66 mg/dL (ref >40)
LDL Cholesterol: 119 mg/dL — ABNORMAL HIGH (ref <100)
TRIGLYCERIDES: 105 mg/dL (ref <150)
Total CHOL/HDL Ratio: 3.1 Ratio (ref <5.0)
VLDL: 21 mg/dL (ref <30)

## 2017-05-08 LAB — RPR

## 2017-05-08 LAB — HEPATITIS B SURFACE ANTIGEN: Hepatitis B Surface Ag: NEGATIVE

## 2017-05-08 LAB — T-HELPER CELL (CD4) - (RCID CLINIC ONLY)
CD4 T CELL HELPER: 18 % — AB (ref 33–55)
CD4 T Cell Abs: 400 /uL (ref 400–2700)

## 2017-05-08 LAB — HEPATITIS B SURFACE ANTIBODY,QUALITATIVE: Hep B S Ab: POSITIVE — AB

## 2017-05-09 LAB — HIV-1 RNA QUANT-NO REFLEX-BLD
HIV 1 RNA Quant: <20 copies/mL
HIV-1 RNA Quant, Log: <1.3 Log copies/mL

## 2017-05-12 LAB — HEPATITIS B DNA, ULTRAQUANTITATIVE, PCR: Hepatitis B DNA (Calc): <1 Log IU/mL

## 2017-05-21 ENCOUNTER — Ambulatory Visit: Payer: BLUE CROSS/BLUE SHIELD | Admitting: Internal Medicine

## 2017-06-03 ENCOUNTER — Telehealth: Payer: Self-pay | Admitting: *Deleted
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
